# Patient Record
Sex: Male | Born: 1960 | Race: White | Hispanic: No | Marital: Married | Smoking: Never smoker
Health system: Southern US, Community
[De-identification: ages and names within clinical notes are randomized; demographics above are authoritative.]

## PROBLEM LIST (undated history)

## (undated) DIAGNOSIS — I10 Essential (primary) hypertension: Secondary | ICD-10-CM

## (undated) DIAGNOSIS — K219 Gastro-esophageal reflux disease without esophagitis: Secondary | ICD-10-CM

---

## 2010-11-26 HISTORY — PX: COLONOSCOPY: SHX174

## 2013-11-21 ENCOUNTER — Encounter (HOSPITAL_COMMUNITY): Payer: Self-pay | Admitting: Emergency Medicine

## 2013-11-21 ENCOUNTER — Telehealth (HOSPITAL_COMMUNITY): Payer: Self-pay | Admitting: *Deleted

## 2013-11-21 ENCOUNTER — Emergency Department (HOSPITAL_COMMUNITY)
Admission: EM | Admit: 2013-11-21 | Discharge: 2013-11-21 | Disposition: A | Discharge status: Home / Self Care | Payer: No Typology Code available for payment source | Attending: Emergency Medicine | Admitting: Emergency Medicine

## 2013-11-21 DIAGNOSIS — L02519 Cutaneous abscess of unspecified hand: Secondary | ICD-10-CM | POA: Insufficient documentation

## 2013-11-21 DIAGNOSIS — I10 Essential (primary) hypertension: Secondary | ICD-10-CM | POA: Insufficient documentation

## 2013-11-21 DIAGNOSIS — L03019 Cellulitis of unspecified finger: Secondary | ICD-10-CM | POA: Insufficient documentation

## 2013-11-21 DIAGNOSIS — IMO0002 Reserved for concepts with insufficient information to code with codable children: Secondary | ICD-10-CM

## 2013-11-21 DIAGNOSIS — Z8614 Personal history of Methicillin resistant Staphylococcus aureus infection: Secondary | ICD-10-CM | POA: Insufficient documentation

## 2013-11-21 DIAGNOSIS — Z7982 Long term (current) use of aspirin: Secondary | ICD-10-CM | POA: Insufficient documentation

## 2013-11-21 DIAGNOSIS — Z79899 Other long term (current) drug therapy: Secondary | ICD-10-CM | POA: Insufficient documentation

## 2013-11-21 DIAGNOSIS — Z792 Long term (current) use of antibiotics: Secondary | ICD-10-CM | POA: Insufficient documentation

## 2013-11-21 HISTORY — DX: Essential (primary) hypertension: I10

## 2013-11-21 LAB — BASIC METABOLIC PANEL
BUN: 18 mg/dL (ref 6–23)
CO2: 25 mEq/L (ref 19–32)
Calcium: 9.2 mg/dL (ref 8.4–10.5)
Chloride: 102 mEq/L (ref 96–112)
Creatinine, Ser: 1.11 mg/dL (ref 0.50–1.35)
GFR calc non Af Amer: 74 mL/min — ABNORMAL LOW (ref >90)
Glucose, Bld: 105 mg/dL — ABNORMAL HIGH (ref 70–99)
Potassium: 4.1 mEq/L (ref 3.5–5.1)
Sodium: 136 mEq/L (ref 135–145)

## 2013-11-21 LAB — CBC
Hemoglobin: 16.3 g/dL (ref 13.0–17.0)
MCH: 31.8 pg (ref 26.0–34.0)
RBC: 5.13 MIL/uL (ref 4.22–5.81)

## 2013-11-21 MED ORDER — SULFAMETHOXAZOLE-TRIMETHOPRIM 800-160 MG PO TABS: 1.0000 | ORAL_TABLET | Freq: Two times a day (BID) | ORAL | Status: DC

## 2013-11-21 NOTE — ED Notes (Signed)
Calling to get a work note

## 2013-11-21 NOTE — ED Provider Notes (Signed)
CSN: [id number]     Arrival date & time 11/21/13  1226 History   First MD Initiated Contact with Patient 11/21/13 1620     Chief Complaint  Patient presents with  . Finger Injury   (Consider location/radiation/quality/duration/timing/severity/associated sxs/prior Treatment) The history is provided by the patient and medical records.   This is a 52 year old male with past medical history significant for hypertension and recurrent MRSA infections, presenting to the ED for infection with right middle finger. Patient states he pulled a hair on his right middle finger and subsequently developed an infection 1 week ago. Patient was given Kenalog shot and started on Keflex which he has been taking for the past week without improvement. Pt has since started experiencing some sero-sanguinous drainage from the finger.  Patient was seen in urgent care and sent to the ED for IV antibiotics and hand surgery consult. Patient denies any recent fevers, sweats, or chills. He does endorse some intermittent numbness and paresthesias of his right middle finger. No prior history of MRSA or osteomyelitis.  Past Medical History  Diagnosis Date  . Hypertension    History reviewed. No pertinent past surgical history. No family history on file. History  Substance Use Topics  . Smoking status: Not on file  . Smokeless tobacco: Not on file  . Alcohol Use: No    Review of Systems  Skin: Positive for wound.  All other systems reviewed and are negative.    Allergies  Review of patient's allergies indicates no known allergies.  Home Medications   Current Outpatient Rx  Name  Route  Sig  Dispense  Refill  . acetaminophen (TYLENOL) 325 MG tablet   Oral   Take 650 mg by mouth every 6 (six) hours as needed.         Marland Kitchen aspirin 81 MG tablet   Oral   Take 81 mg by mouth daily.         . cephALEXin (KEFLEX) 500 MG capsule   Oral   Take 500 mg by mouth 2 (two) times daily.         . Cholecalciferol  (VITAMIN D) 2000 UNITS CAPS   Oral   Take 1 capsule by mouth daily.         Marland Kitchen losartan (COZAAR) 100 MG tablet   Oral   Take 100 mg by mouth daily.         . nabumetone (RELAFEN) 750 MG tablet   Oral   Take 750 mg by mouth 2 (two) times daily as needed for moderate pain.         Marland Kitchen neomycin-bacitracin-polymyxin (NEOSPORIN) ointment   Topical   Apply 1 application topically 3 (three) times daily. apply to eye         . Omega-3 Fatty Acids (FISH OIL) 1200 MG CAPS   Oral   Take 1 capsule by mouth 2 (two) times daily.         Marland Kitchen omeprazole (PRILOSEC) 40 MG capsule   Oral   Take 40 mg by mouth daily.          BP 150/92  Pulse 61  Temp(Src) 97.9 F (36.6 C) (Oral)  Resp 16  Wt 206 lb 7 oz (93.639 kg)  SpO2 100%  Physical Exam  Nursing note and vitals reviewed. Constitutional: He is oriented to person, place, and time. He appears well-developed and well-nourished.  HENT:  Head: Normocephalic and atraumatic.  Mouth/Throat: Oropharynx is clear and moist.  Eyes: Conjunctivae and EOM are  normal. Pupils are equal, round, and reactive to light.  Neck: Normal range of motion.  Cardiovascular: Normal rate, regular rhythm and normal heart sounds.   Pulmonary/Chest: Effort normal and breath sounds normal. No respiratory distress. He has no wheezes.  Musculoskeletal: Normal range of motion.       Right hand: He exhibits tenderness and swelling. He exhibits no deformity and no laceration.       Hands: Right middle finger with cellulitis along the dorsal proximal phalanx, faint streaking into dorsal hand; small area of open ulceration with serosanguineous drainage; limited range of motion in the finger due to swelling however has full passive ROM; strong radial pulse and cap refill; sensation itnact  Neurological: He is alert and oriented to person, place, and time.  Skin: Skin is warm and dry.  Psychiatric: He has a normal mood and affect.       ED Course  Procedures  (including critical care time)  INCISION AND DRAINAGE Performed by: Garlon Hatchet Consent: Verbal consent obtained. Risks and benefits: risks, benefits and alternatives were discussed Type: abscess  Body area: right middle finger, proximal phalanx  Anesthesia: local infiltration  Incision was made with a scalpel.  Local anesthetic: lidocaine 1% without epinephrine  Anesthetic total: 4 ml  Complexity: complex Blunt dissection to break up loculations  Drainage: sero-sanguinous, bloody  Drainage amount: small  Packing material: none  Patient tolerance: Patient tolerated the procedure well with no immediate complications.    Labs Review Labs Reviewed  CBC  BASIC METABOLIC PANEL   Imaging Review No results found.  EKG Interpretation   None       MDM   1. Cellulitis and abscess of digit    Bedside ultrasound revealing small pocket of fluid beneath skin surface.  I&D performed with small amount of sero-sanguinous drainage expelled.  Pt has on localized cellulitis without extension throughout finger, normal sensation, full passive ROM-- low suspicion for tendon sheath infection at this time.  Pt afebrile, non-toxic appearing, NAD, VS stable- ok for discharge.  Will change abx to bactrim.  Instructed pt on home wound care and monitor for signs of infection-- increased/spreading redness, purulent drainage, swelling, high fever, chills, etc.  Discussed plan with pt and wife, they acknowledged understanding and agreed with plan of care.  Return precautions advised.  Discussed with Dr. Fonnie Jarvis who personally evaluated pt and agrees with assessment and plan of care.  Garlon Hatchet, PA-C 11/21/13 1905

## 2013-11-21 NOTE — ED Notes (Signed)
Pt. Stated, pulled a hair and it became infected on his right middle finger.  Pt. Has taken a Kenolog shot on Monday and Keflex since Monday twice a day. Its no better.  Red and swollen.  Its the 4th episode of infections.

## 2013-11-21 NOTE — ED Provider Notes (Signed)
Medical screening examination/treatment/procedure(s) were conducted as a shared visit with non-physician practitioner(s) and myself.  I personally evaluated the patient during the encounter.  EKG Interpretation   None      Limited bedside US reveals apparent localized non-midline subcut fluid collection suggesting possible abscess  Hurman Horn, MD 11/23/13 2117

## 2013-12-03 ENCOUNTER — Encounter (HOSPITAL_BASED_OUTPATIENT_CLINIC_OR_DEPARTMENT_OTHER): Payer: Self-pay | Admitting: *Deleted

## 2013-12-03 ENCOUNTER — Other Ambulatory Visit: Payer: Self-pay

## 2013-12-03 ENCOUNTER — Other Ambulatory Visit: Payer: Self-pay | Admitting: Orthopedic Surgery

## 2013-12-03 ENCOUNTER — Encounter (HOSPITAL_BASED_OUTPATIENT_CLINIC_OR_DEPARTMENT_OTHER)
Admission: RE | Disposition: A | Discharge status: Home / Self Care | Payer: Self-pay | Source: Ambulatory Visit | Attending: Orthopedic Surgery

## 2013-12-03 ENCOUNTER — Ambulatory Visit (HOSPITAL_BASED_OUTPATIENT_CLINIC_OR_DEPARTMENT_OTHER): Payer: No Typology Code available for payment source | Admitting: Certified Registered"

## 2013-12-03 ENCOUNTER — Encounter (HOSPITAL_BASED_OUTPATIENT_CLINIC_OR_DEPARTMENT_OTHER): Payer: No Typology Code available for payment source | Admitting: Certified Registered"

## 2013-12-03 ENCOUNTER — Ambulatory Visit (HOSPITAL_BASED_OUTPATIENT_CLINIC_OR_DEPARTMENT_OTHER)
Admission: RE | Admit: 2013-12-03 | Discharge: 2013-12-03 | Disposition: A | Discharge status: Home / Self Care | Payer: No Typology Code available for payment source | Source: Ambulatory Visit | Attending: Orthopedic Surgery | Admitting: Orthopedic Surgery

## 2013-12-03 DIAGNOSIS — Z7982 Long term (current) use of aspirin: Secondary | ICD-10-CM | POA: Insufficient documentation

## 2013-12-03 DIAGNOSIS — K219 Gastro-esophageal reflux disease without esophagitis: Secondary | ICD-10-CM | POA: Insufficient documentation

## 2013-12-03 DIAGNOSIS — I10 Essential (primary) hypertension: Secondary | ICD-10-CM | POA: Insufficient documentation

## 2013-12-03 DIAGNOSIS — L0889 Other specified local infections of the skin and subcutaneous tissue: Secondary | ICD-10-CM | POA: Insufficient documentation

## 2013-12-03 DIAGNOSIS — Z792 Long term (current) use of antibiotics: Secondary | ICD-10-CM | POA: Insufficient documentation

## 2013-12-03 HISTORY — DX: Gastro-esophageal reflux disease without esophagitis: K21.9

## 2013-12-03 HISTORY — PX: INCISION AND DRAINAGE: SHX5863

## 2013-12-03 SURGERY — INCISION AND DRAINAGE
Anesthesia: General | Site: Hand | Laterality: Right

## 2013-12-03 MED ORDER — VANCOMYCIN HCL IN DEXTROSE 500-5 MG/100ML-% IV SOLN
INTRAVENOUS | Status: AC
  Filled 2013-12-03: qty 200

## 2013-12-03 MED ORDER — 0.9 % SODIUM CHLORIDE (POUR BTL) OPTIME
TOPICAL | Status: DC | PRN
  Administered 2013-12-03: 300 mL

## 2013-12-03 MED ORDER — ONDANSETRON HCL 4 MG/2ML IJ SOLN
INTRAMUSCULAR | Status: DC | PRN
  Administered 2013-12-03: 4 mg via INTRAVENOUS

## 2013-12-03 MED ORDER — PROPOFOL 10 MG/ML IV BOLUS
INTRAVENOUS | Status: AC
  Filled 2013-12-03: qty 20

## 2013-12-03 MED ORDER — VANCOMYCIN HCL 1000 MG IV SOLR
1000.0000 mg | INTRAVENOUS | Status: DC | PRN
  Administered 2013-12-03: 1000 mg via INTRAVENOUS

## 2013-12-03 MED ORDER — ONDANSETRON HCL 4 MG/2ML IJ SOLN
4.0000 mg | Freq: Once | INTRAMUSCULAR | Status: DC | PRN
Start: 2013-12-03 — End: 2013-12-03

## 2013-12-03 MED ORDER — SULFAMETHOXAZOLE-TRIMETHOPRIM 800-160 MG PO TABS: 1.0000 | ORAL_TABLET | Freq: Two times a day (BID) | ORAL | Status: DC

## 2013-12-03 MED ORDER — HYDROMORPHONE HCL PF 1 MG/ML IJ SOLN
0.2500 mg | INTRAMUSCULAR | Status: DC | PRN
  Administered 2013-12-03: 0.5 mg via INTRAVENOUS
  Filled 2013-12-03: qty 1

## 2013-12-03 MED ORDER — MIDAZOLAM HCL 5 MG/5ML IJ SOLN
INTRAMUSCULAR | Status: DC | PRN
  Administered 2013-12-03: 2 mg via INTRAVENOUS

## 2013-12-03 MED ORDER — BUPIVACAINE HCL (PF) 0.25 % IJ SOLN
INTRAMUSCULAR | Status: AC
  Filled 2013-12-03: qty 30

## 2013-12-03 MED ORDER — CHLORHEXIDINE GLUCONATE 4 % EX LIQD: 60.0000 mL | Freq: Once | CUTANEOUS | Status: DC

## 2013-12-03 MED ORDER — PROPOFOL 10 MG/ML IV BOLUS
INTRAVENOUS | Status: DC | PRN
  Administered 2013-12-03: 200 mg via INTRAVENOUS

## 2013-12-03 MED ORDER — OXYCODONE HCL 5 MG PO TABS: 5.0000 mg | ORAL_TABLET | Freq: Once | ORAL | Status: DC | PRN

## 2013-12-03 MED ORDER — BUPIVACAINE HCL (PF) 0.25 % IJ SOLN
INTRAMUSCULAR | Status: DC | PRN
  Administered 2013-12-03: 9.5 mL

## 2013-12-03 MED ORDER — DEXAMETHASONE SODIUM PHOSPHATE 10 MG/ML IJ SOLN
INTRAMUSCULAR | Status: DC | PRN
  Administered 2013-12-03: 10 mg via INTRAVENOUS

## 2013-12-03 MED ORDER — OXYCODONE HCL 5 MG/5ML PO SOLN: 5.0000 mg | Freq: Once | ORAL | Status: DC | PRN

## 2013-12-03 MED ORDER — HYDROCODONE-ACETAMINOPHEN 5-325 MG PO TABS: ORAL_TABLET | ORAL | Status: DC

## 2013-12-03 MED ORDER — FENTANYL CITRATE 0.05 MG/ML IJ SOLN
INTRAMUSCULAR | Status: DC | PRN
  Administered 2013-12-03 (×2): 50 ug via INTRAVENOUS

## 2013-12-03 MED ORDER — FENTANYL CITRATE 0.05 MG/ML IJ SOLN
INTRAMUSCULAR | Status: AC
  Filled 2013-12-03: qty 4

## 2013-12-03 MED ORDER — MIDAZOLAM HCL 2 MG/2ML IJ SOLN
INTRAMUSCULAR | Status: AC
  Filled 2013-12-03: qty 2

## 2013-12-03 MED ORDER — LACTATED RINGERS IV SOLN
INTRAVENOUS | Status: DC
  Administered 2013-12-03: 13:00:00 via INTRAVENOUS
  Administered 2013-12-03: 20 mL/h via INTRAVENOUS

## 2013-12-03 MED ORDER — LIDOCAINE HCL (CARDIAC) 20 MG/ML IV SOLN
INTRAVENOUS | Status: DC | PRN
  Administered 2013-12-03: 60 mg via INTRAVENOUS

## 2013-12-03 SURGICAL SUPPLY — 50 items
BAG DECANTER FOR FLEXI CONT (MISCELLANEOUS) IMPLANT
BANDAGE ELASTIC 3 VELCRO ST LF (GAUZE/BANDAGES/DRESSINGS) ×2 IMPLANT
BANDAGE GAUZE STRT 1 STR LF (GAUZE/BANDAGES/DRESSINGS) IMPLANT
BLADE MINI RND TIP GREEN BEAV (BLADE) IMPLANT
BLADE SURG 15 STRL LF DISP TIS (BLADE) ×2 IMPLANT
BLADE SURG 15 STRL SS (BLADE) ×2
BNDG COHESIVE 1X5 TAN STRL LF (GAUZE/BANDAGES/DRESSINGS) IMPLANT
BNDG ELASTIC 2 VLCR STRL LF (GAUZE/BANDAGES/DRESSINGS) IMPLANT
BNDG ESMARK 4X9 LF (GAUZE/BANDAGES/DRESSINGS) ×2 IMPLANT
BNDG GAUZE ELAST 4 BULKY (GAUZE/BANDAGES/DRESSINGS) ×2 IMPLANT
CHLORAPREP W/TINT 26ML (MISCELLANEOUS) ×2 IMPLANT
CORDS BIPOLAR (ELECTRODE) ×2 IMPLANT
COVER MAYO STAND STRL (DRAPES) ×2 IMPLANT
COVER TABLE BACK 60X90 (DRAPES) ×2 IMPLANT
CUFF TOURNIQUET SINGLE 18IN (TOURNIQUET CUFF) ×2 IMPLANT
DRAPE EXTREMITY T 121X128X90 (DRAPE) ×2 IMPLANT
DRAPE SURG 17X23 STRL (DRAPES) ×2 IMPLANT
GAUZE PACKING IODOFORM 1/4X15 (GAUZE/BANDAGES/DRESSINGS) ×2 IMPLANT
GAUZE XEROFORM 1X8 LF (GAUZE/BANDAGES/DRESSINGS) IMPLANT
GLOVE BIO SURGEON STRL SZ7.5 (GLOVE) ×2 IMPLANT
GLOVE BIOGEL PI IND STRL 7.0 (GLOVE) ×2 IMPLANT
GLOVE BIOGEL PI IND STRL 8 (GLOVE) ×1 IMPLANT
GLOVE BIOGEL PI INDICATOR 7.0 (GLOVE) ×2
GLOVE BIOGEL PI INDICATOR 8 (GLOVE) ×1
GLOVE ECLIPSE 6.5 STRL STRAW (GLOVE) ×2 IMPLANT
GLOVE ECLIPSE 7.0 STRL STRAW (GLOVE) ×2 IMPLANT
GLOVE EXAM NITRILE PF MED BLUE (GLOVE) ×2 IMPLANT
GOWN STRL REUS W/ TWL LRG LVL3 (GOWN DISPOSABLE) ×1 IMPLANT
GOWN STRL REUS W/TWL LRG LVL3 (GOWN DISPOSABLE) ×1
GOWN STRL REUS W/TWL XL LVL3 (GOWN DISPOSABLE) ×2 IMPLANT
LOOP VESSEL MAXI BLUE (MISCELLANEOUS) IMPLANT
NEEDLE HYPO 25X1 1.5 SAFETY (NEEDLE) ×2 IMPLANT
NS IRRIG 1000ML POUR BTL (IV SOLUTION) ×2 IMPLANT
PACK BASIN DAY SURGERY FS (CUSTOM PROCEDURE TRAY) ×2 IMPLANT
PAD CAST 3X4 CTTN HI CHSV (CAST SUPPLIES) IMPLANT
PADDING CAST ABS 4INX4YD NS (CAST SUPPLIES) ×1
PADDING CAST ABS COTTON 4X4 ST (CAST SUPPLIES) ×1 IMPLANT
PADDING CAST COTTON 3X4 STRL (CAST SUPPLIES)
SPLINT PLASTER CAST XFAST 3X15 (CAST SUPPLIES) ×10 IMPLANT
SPLINT PLASTER XTRA FASTSET 3X (CAST SUPPLIES) ×10
SPONGE GAUZE 4X4 12PLY (GAUZE/BANDAGES/DRESSINGS) ×2 IMPLANT
STOCKINETTE 4X48 STRL (DRAPES) ×2 IMPLANT
SUT ETHILON 4 0 PS 2 18 (SUTURE) IMPLANT
SWAB COLLECTION DEVICE MRSA (MISCELLANEOUS) ×2 IMPLANT
SYR BULB 3OZ (MISCELLANEOUS) ×2 IMPLANT
SYR CONTROL 10ML LL (SYRINGE) ×2 IMPLANT
TOWEL OR 17X24 6PK STRL BLUE (TOWEL DISPOSABLE) ×2 IMPLANT
TUBE ANAEROBIC SPECIMEN COL (MISCELLANEOUS) ×2 IMPLANT
TUBE FEEDING 5FR 15 INCH (TUBING) IMPLANT
UNDERPAD 30X30 INCONTINENT (UNDERPADS AND DIAPERS) ×2 IMPLANT

## 2013-12-03 NOTE — H&P (Signed)
  Melvin Crawford is an 53 y.o. male.   Chief Complaint: right long finger infection HPI: 53 yo rhd male states he pulled hair from right index finger ~11/15/13 and started to have redness and swelling in area a couple of days later.  [facility] where I&D performed by ED staff.  Seen at PCP afterward.  Has had continued erythema of area.  No fevers, chills, night sweats.  No previous problem with right long finger.  Past Medical History  Diagnosis Date  . Hypertension   . GERD (gastroesophageal reflux disease)     Past Surgical History  Procedure Laterality Date  . Colonoscopy  2012    History reviewed. No pertinent family history. Social History:  reports that he has never smoked. He has never used smokeless tobacco. He reports that he does not drink alcohol or use illicit drugs.  Allergies:  Allergies  Allergen Reactions  . Codeine Nausea And Vomiting    Medications Prior to Admission  Medication Sig Dispense Refill  . acetaminophen (TYLENOL) 325 MG tablet Take 650 mg by mouth every 6 (six) hours as needed.      Marland Kitchen. aspirin 81 MG tablet Take 81 mg by mouth daily.      . cephALEXin (KEFLEX) 500 MG capsule Take 500 mg by mouth 2 (two) times daily.      . Cholecalciferol (VITAMIN D) 2000 UNITS CAPS Take 1 capsule by mouth daily.      Marland Kitchen. losartan (COZAAR) 100 MG tablet Take 100 mg by mouth daily.      . nabumetone (RELAFEN) 750 MG tablet Take 750 mg by mouth 2 (two) times daily as needed for moderate pain.      Marland Kitchen. neomycin-bacitracin-polymyxin (NEOSPORIN) ointment Apply 1 application topically 3 (three) times daily. apply to finger      . Omega-3 Fatty Acids (FISH OIL) 1200 MG CAPS Take 1 capsule by mouth 2 (two) times daily.      Marland Kitchen. omeprazole (PRILOSEC) 40 MG capsule Take 40 mg by mouth daily.      Marland Kitchen. sulfamethoxazole-trimethoprim (SEPTRA DS) 800-160 MG per tablet Take 1 tablet by mouth every 12 (twelve) hours.  20 tablet  0    No results found for this or any previous  visit (from the past 48 hour(s)).  No results found.   A comprehensive review of systems was negative except for: Eyes: positive for contacts/glasses  Blood pressure 151/90, pulse 57, temperature 97.5 F (36.4 C), temperature source Oral, resp. rate 20, height 5\' 9"  (1.753 m), weight 208 lb 2 oz (94.405 kg), SpO2 99.00%.  General appearance: alert, cooperative and appears stated age Head: Normocephalic, without obvious abnormality, atraumatic Neck: supple, symmetrical, trachea midline Resp: clear to auscultation bilaterally Cardio: regular rate and rhythm GI: non tender Extremities: intact sensation and capillary refill all digits.  +epl/fpl/io.  right long with erythema dorsally over proximal phalanx.  no proximal streaking.  no active drainage.  no volar tenderness or erythema. Pulses: 2+ and symmetric Skin: as above Neurologic: Grossly normal Incision/Wound: As above  Assessment/Plan Right long finger infection.  Non operative and operative treatment options were discussed with the patient and patient wishes to proceed with operative treatment. Plan I&D right long finger in OR.  Risks, benefits, and alternatives of surgery were discussed and the patient agrees with the plan of care.   Melvin Crawford R 12/03/2013, 1:06 PM

## 2013-12-03 NOTE — Anesthesia Postprocedure Evaluation (Signed)
  Anesthesia Post-op Note  Patient: Melvin Crawford  Procedure(s) Performed: Procedure(s): INCISION AND DRAINAGE RIGHT LONG FINGER  (Right)  Patient Location: PACU  Anesthesia Type:General  Level of Consciousness: awake, alert  and oriented  Airway and Oxygen Therapy: Patient Spontanous Breathing  Post-op Pain: mild  Post-op Assessment: Post-op Vital signs reviewed  Post-op Vital Signs: Reviewed  Complications: No apparent anesthesia complications

## 2013-12-03 NOTE — Discharge Instructions (Addendum)

## 2013-12-03 NOTE — Brief Op Note (Signed)
12/03/2013  1:58 PM  PATIENT:  Melvin Crawford  53 y.o. male  PRE-OPERATIVE DIAGNOSIS:  right long finger infection  POST-OPERATIVE DIAGNOSIS: right long finger infection  PROCEDURE:  Procedure(s) with comments: INCISION AND DRAINAGE (Right) - I&D RIGHT LONG FINGER   SURGEON:  Surgeon(s) and Role:    * Tami RibasKevin R Adena Sima, MD - Primary  PHYSICIAN ASSISTANT:   ASSISTANTS: none   ANESTHESIA:   general  EBL:  Total I/O In: 700 [I.V.:700] Out: -   BLOOD ADMINISTERED:none  DRAINS: iodoform packing  LOCAL MEDICATIONS USED:  MARCAINE     SPECIMEN:  Source of Specimen:  right long finger  DISPOSITION OF SPECIMEN:  micro  COUNTS:  YES  TOURNIQUET:   Total Tourniquet Time Documented: Upper Arm (Right) - 11 minutes Total: Upper Arm (Right) - 11 minutes   DICTATION: .Other Dictation: Dictation Number [id number]  PLAN OF CARE: Discharge to home after PACU  PATIENT DISPOSITION:  PACU - hemodynamically stable.

## 2013-12-03 NOTE — Op Note (Signed)
[id number] 

## 2013-12-03 NOTE — Transfer of Care (Signed)
Immediate Anesthesia Transfer of Care Note  Patient: Melvin Crawford  Procedure(s) Performed: Procedure(s) with comments: INCISION AND DRAINAGE (Right) - I&D RIGHT LONG FINGER   Patient Location: PACU  Anesthesia Type:General  Level of Consciousness: awake, alert , oriented and patient cooperative  Airway & Oxygen Therapy: Patient Spontanous Breathing and Patient connected to face mask oxygen  Post-op Assessment: Report given to PACU RN and Post -op Vital signs reviewed and stable  Post vital signs: Reviewed and stable  Complications: No apparent anesthesia complications

## 2013-12-03 NOTE — Anesthesia Preprocedure Evaluation (Signed)
Anesthesia Evaluation  Patient identified by MRN, date of birth, ID band Patient awake    Reviewed: Allergy & Precautions, H&P , NPO status , Patient's Chart, lab work & pertinent test results  Airway Mallampati: I TM Distance: >3 FB Neck ROM: Full    Dental  (+) Teeth Intact, Partial Lower and Dental Advisory Given   Pulmonary  breath sounds clear to auscultation        Cardiovascular hypertension, Pt. on medications Rhythm:Regular Rate:Normal     Neuro/Psych    GI/Hepatic   Endo/Other    Renal/GU      Musculoskeletal   Abdominal   Peds  Hematology   Anesthesia Other Findings   Reproductive/Obstetrics                           Anesthesia Physical Anesthesia Plan  ASA: II  Anesthesia Plan: General   Post-op Pain Management:    Induction: Intravenous  Airway Management Planned: LMA  Additional Equipment:   Intra-op Plan:   Post-operative Plan: Extubation in OR  Informed Consent: I have reviewed the patients History and Physical, chart, labs and discussed the procedure including the risks, benefits and alternatives for the proposed anesthesia with the patient or authorized representative who has indicated his/her understanding and acceptance.   Dental advisory given  Plan Discussed with: CRNA, Anesthesiologist and Surgeon  Anesthesia Plan Comments:         Anesthesia Quick Evaluation

## 2013-12-03 NOTE — Anesthesia Procedure Notes (Signed)
Procedure Name: LMA Insertion Date/Time: 12/03/2013 1:34 PM Performed by: Berdell Nevitt Pre-anesthesia Checklist: Patient identified, Emergency Drugs available, Suction available and Patient being monitored Patient Re-evaluated:Patient Re-evaluated prior to inductionOxygen Delivery Method: Circle System Utilized Preoxygenation: Pre-oxygenation with 100% oxygen Intubation Type: IV induction Ventilation: Mask ventilation without difficulty LMA: LMA inserted LMA Size: 5.0 Number of attempts: 1 Airway Equipment and Method: bite block Placement Confirmation: positive ETCO2 Tube secured with: Tape Dental Injury: Teeth and Oropharynx as per pre-operative assessment

## 2013-12-04 ENCOUNTER — Encounter (HOSPITAL_BASED_OUTPATIENT_CLINIC_OR_DEPARTMENT_OTHER): Payer: Self-pay | Admitting: Orthopedic Surgery

## 2013-12-04 LAB — POCT I-STAT, CHEM 8
BUN: 16 mg/dL (ref 6–23)
CALCIUM ION: 1.2 mmol/L (ref 1.12–1.23)
CREATININE: 1.3 mg/dL (ref 0.50–1.35)
Chloride: 102 mEq/L (ref 96–112)
Glucose, Bld: 107 mg/dL — ABNORMAL HIGH (ref 70–99)
HCT: 50 % (ref 39.0–52.0)
Hemoglobin: 17 g/dL (ref 13.0–17.0)
Potassium: 4.4 mEq/L (ref 3.7–5.3)
Sodium: 137 mEq/L (ref 137–147)
TCO2: 24 mmol/L (ref 0–100)

## 2013-12-04 NOTE — Op Note (Signed)
NAME:  Melvin Crawford, Melvin Crawford             ACCOUNT NO.:  [id number]  MEDICAL RECORD NO.:  [id number]  LOCATION:                                 FACILITY:  PHYSICIAN:  Betha LoaKevin Lamari Beckles, MD        DATE OF BIRTH:  Oct 08, 1961  DATE OF PROCEDURE:  12/03/2013 DATE OF DISCHARGE:                              OPERATIVE REPORT   PREOPERATIVE DIAGNOSIS:  Right long finger infection.  POSTOPERATIVE DIAGNOSIS:  Right long finger infection.  PROCEDURE:  Incision and drainage of right long finger.  SURGEON:  Betha LoaKevin Dyamon Sosinski, MD  ASSISTANT:  None.  ANESTHESIA:  General.  IV FLUIDS:  Per anesthesia flow sheet.  ESTIMATED BLOOD LOSS:  Minimal.  COMPLICATIONS:  None.  SPECIMENS:  Cultures from right long finger to micro.  TOURNIQUET TIME:  11 minutes.  DISPOSITION:  Stable to PACU.  INDICATIONS:  Melvin Crawford is a 53 year old male who approximately 2 weeks ago pulled a hair out of his finger and then began to have swelling and erythema of the finger.  [facility] 9 days ago where an incision and drainage was performed.  He expressed significant amount of pus from the wound the following day.  He has had continued erythema of the finger.  He was referred to me for further care.  I discussed with Melvin Crawford and his wife the nature of the condition.  We discussed continued antibiotic therapy versus operative incision and drainage.  They elected to proceed with operative incision and drainage.  Risks, benefits, and alternatives of the surgery were discussed including risk of blood loss, infection, damage to nerves, vessels, tendons, ligaments, bone; failure of surgery; need for additional surgery, complications with wound healing, continued pain, continued infection, need for repeat irrigation and debridement. They voiced understanding of these risks and elected to proceed.  OPERATIVE COURSE:  After being identified preoperatively by myself, the patient and I  agreed upon procedure and site of procedure.  Surgical site was marked.  The risks, benefits, and alternatives of surgery were reviewed and he wished to proceed.  Surgical consent had been signed. He was transported to the operating room, placed on the operating room table in a supine position with the right upper extremity on arm board. General anesthesia was induced by the anesthesiologist.  The right upper extremity was prepped and draped in normal sterile orthopedic fashion. Surgical pause was performed between surgeons, anesthesia, and operating room staff, and all were in agreement as to the patient, procedure, and site of procedure.  Tourniquet at the proximal aspect of the extremity was inflated to 250 mmHg after gravity exsanguination of the hand and Esmarch exsanguination of the forearm.  Incision was made on the dorsum of the finger in the area of erythema.  Skin and subcutaneous tissues by spreading technique.  There was no gross purulence.  The fat was very indurated.  There was some cloudy whitish fluid.  Cultures were taken for aerobes and anaerobes and sent to micro for examination.  The wound was copiously irrigated with sterile saline.  It was inspected to ensure there was no separate cavity, which there was none.  The wound  was then packed open with quarter-inch iodoform gauze.  A digital block was performed with 10 mL of 0.25% plain Marcaine to aid in postoperative analgesia.  The wound was dressed with sterile 4x4s and wrapped with a Kerlix bandage.  A volar splint was placed and wrapped with Kerlix and Ace bandage.  Tourniquet was deflated at 11 minutes.  Fingertips were pink with brisk capillary refill after deflation of the tourniquet. Operative drapes were broken down and the patient was awoken from anesthesia safely.  He was transferred back to stretcher and taken to PACU in stable condition.  I will see him back in the office on Monday for operative followup.  I  will give him Norco 5/325, 1-2 p.o. q.6 hours p.r.n. pain, dispensed #30 and Bactrim DS 1 p.o. b.i.d. x7 days.     Betha Loa, MD     KK/MEDQ  D:  12/03/2013  T:  12/04/2013  Job:  [id number]

## 2013-12-07 LAB — CULTURE, ROUTINE-ABSCESS: CULTURE: NO GROWTH

## 2013-12-08 LAB — ANAEROBIC CULTURE

## 2016-01-17 DIAGNOSIS — L409 Psoriasis, unspecified: Secondary | ICD-10-CM

## 2016-01-17 DIAGNOSIS — G4730 Sleep apnea, unspecified: Secondary | ICD-10-CM | POA: Insufficient documentation

## 2016-01-17 DIAGNOSIS — R5382 Chronic fatigue, unspecified: Secondary | ICD-10-CM | POA: Insufficient documentation

## 2016-01-17 DIAGNOSIS — E785 Hyperlipidemia, unspecified: Secondary | ICD-10-CM

## 2016-01-17 DIAGNOSIS — Z79899 Other long term (current) drug therapy: Secondary | ICD-10-CM

## 2016-01-17 DIAGNOSIS — J984 Other disorders of lung: Secondary | ICD-10-CM

## 2016-01-17 DIAGNOSIS — N529 Male erectile dysfunction, unspecified: Secondary | ICD-10-CM

## 2016-01-17 DIAGNOSIS — E559 Vitamin D deficiency, unspecified: Secondary | ICD-10-CM

## 2016-01-17 DIAGNOSIS — I10 Essential (primary) hypertension: Secondary | ICD-10-CM | POA: Insufficient documentation

## 2016-01-17 HISTORY — DX: Vitamin D deficiency, unspecified: E55.9

## 2016-01-17 HISTORY — DX: Hyperlipidemia, unspecified: E78.5

## 2016-01-17 HISTORY — DX: Chronic fatigue, unspecified: R53.82

## 2016-01-17 HISTORY — DX: Essential (primary) hypertension: I10

## 2016-01-17 HISTORY — DX: Sleep apnea, unspecified: G47.30

## 2016-01-17 HISTORY — DX: Male erectile dysfunction, unspecified: N52.9

## 2016-01-17 HISTORY — DX: Psoriasis, unspecified: L40.9

## 2016-01-17 HISTORY — DX: Other long term (current) drug therapy: Z79.899

## 2016-01-17 HISTORY — DX: Other disorders of lung: J98.4

## 2016-01-18 DIAGNOSIS — Z0000 Encounter for general adult medical examination without abnormal findings: Secondary | ICD-10-CM

## 2016-01-18 DIAGNOSIS — J40 Bronchitis, not specified as acute or chronic: Secondary | ICD-10-CM

## 2016-01-18 HISTORY — DX: Bronchitis, not specified as acute or chronic: J40

## 2016-01-18 HISTORY — DX: Encounter for general adult medical examination without abnormal findings: Z00.00

## 2016-02-17 DIAGNOSIS — R7303 Prediabetes: Secondary | ICD-10-CM

## 2016-02-17 HISTORY — DX: Prediabetes: R73.03

## 2016-02-28 ENCOUNTER — Ambulatory Visit (INDEPENDENT_AMBULATORY_CARE_PROVIDER_SITE_OTHER)
Admission: RE | Admit: 2016-02-28 | Discharge: 2016-02-28 | Disposition: A | Discharge status: Home / Self Care | Payer: No Typology Code available for payment source | Source: Ambulatory Visit | Attending: Pulmonary Disease | Admitting: Pulmonary Disease

## 2016-02-28 ENCOUNTER — Encounter (INDEPENDENT_AMBULATORY_CARE_PROVIDER_SITE_OTHER): Payer: Self-pay

## 2016-02-28 ENCOUNTER — Encounter: Payer: Self-pay | Admitting: Pulmonary Disease

## 2016-02-28 ENCOUNTER — Ambulatory Visit (INDEPENDENT_AMBULATORY_CARE_PROVIDER_SITE_OTHER): Payer: No Typology Code available for payment source | Admitting: Pulmonary Disease

## 2016-02-28 VITALS — BP 138/78 | HR 75 | Ht 69.0 in | Wt 214.0 lb

## 2016-02-28 DIAGNOSIS — R0689 Other abnormalities of breathing: Secondary | ICD-10-CM

## 2016-02-28 DIAGNOSIS — R0600 Dyspnea, unspecified: Secondary | ICD-10-CM | POA: Diagnosis not present

## 2016-02-28 NOTE — Patient Instructions (Signed)
We will schedule here for a chest x-ray to evaluate for interstitial lung disease.  Please follow up in the sleep clinic for evaluation of her sleep apnea. Continue using her albuterol rescue inhaler.  Follow-up in 3 months.

## 2016-02-28 NOTE — Progress Notes (Signed)
Subjective:    Patient ID: Melvin Crawford, male    DOB: 1961/09/11, 55 y.o.   MRN: [id number]  HPI Consult for evaluation of dyspnea.  Melvin Crawford is a 55 year old with possible history of hypertension, GERD. He has complains of dyspnea for the past 3 years. He mostly has wheezing at night associated with cough and white sputum production. He also has dyspnea on exertion. He's had an acute exacerbation the past month and was treated with antibiotic and steroids. He is feeling back to his baseline now. He was evaluated by pulmonary at [facility] in the past with mild abnormalities on PFTs. He is only on albuterol rescue inhaler which seems to help with his symptoms.  He has a diagnosis of mild OSA diagnosed in 2013 but never started on CPAP or had a CPAP titration. He continues to have significant snoring, daytime somnolence, fatigue.  DATA: PFTs 11/23/14 FVC 3.19 (69%) FEV1 2.76 [74%] F/F 81 TLC 78 DLCO 84% Mild restrictive lung disease. No obstruction or DLCO impairment. No bronchodilator response.  Sleep study 11/11/3 Mild OSA, AHI 12  Social History: He smoked about 1 pack per week for 2 years. He quit 26 years ago. No alcohol, drug use. 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 No exposures at work or at home.  Family History: Heart disease-father, mother Prostate cancer-father  Past Medical History  Diagnosis Date  . Hypertension   . GERD (gastroesophageal reflux disease)     Current outpatient prescriptions:  .  albuterol (PROVENTIL HFA;VENTOLIN HFA) 108 (90 Base) MCG/ACT inhaler, Inhale 2 puffs into the lungs every 6 (six) hours as needed for wheezing or shortness of breath., Disp: , Rfl:  .  aspirin 81 MG tablet, Take 81 mg by mouth daily., Disp: , Rfl:  .  Cholecalciferol (VITAMIN D) 2000 UNITS CAPS, Take 1 capsule by mouth daily., Disp: , Rfl:  .  Cyanocobalamin (B-12) 1000 MCG CAPS, Take 1,200 mg by mouth., Disp: , Rfl:  .  losartan (COZAAR) 100 MG  tablet, Take 100 mg by mouth daily., Disp: , Rfl:  .  nabumetone (RELAFEN) 750 MG tablet, Take 750 mg by mouth 2 (two) times daily as needed for moderate pain., Disp: , Rfl:  .  Omega-3 Fatty Acids (FISH OIL) 1200 MG CAPS, Take 1 capsule by mouth 2 (two) times daily., Disp: , Rfl:  .  omeprazole (PRILOSEC) 40 MG capsule, Take 40 mg by mouth daily., Disp: , Rfl:   Review of Systems Cough, dyspnea, wheezing, no sputum, hemoptysis. No chest pain, palpitation. No fevers, chills, malaise, fatigue, loss of weight or loss of appetite. No nausea, vomiting, diarrhea, constipation. All other review of systems are negative.    Objective:   Physical Exam Blood pressure 138/78, pulse 75, height  (1.753 m), weight 214 lb (97.07 kg), SpO2 96 %. Gen: No apparent distress Neuro: No gross focal deficits. HEENT: No JVD, lymphadenopathy, thyromegaly. RS: Clear, No wheeze or crackles CVS: S1-S2 heard, no murmurs rubs gallops. Abdomen: Soft, positive bowel sounds. Musculoskeletal: No edema.    Assessment & Plan:  Evaluation for dyspnea.  He may have reactive airway disease probably exacerbated by GERD. He is on adequate treatment with PPI. He is currently well controlled on just on albuterol inhaler and I do not feel that he'll need an additional long-term controller medication unless there is worsening of his symptoms. Review of his PFTs from 2015 showed only mild restriction. This is likely secondary to his obesity and body  habitus. I'll get a chest x-ray to make sure that there is no interstitial abnormalities. I have encouraged him to undergo a weight loss program with exercise.  Sleep apnea, not on CPAP treatment. He has a follow-up in the sleep clinic arranged for later this month.  Plan: - Continue albuterol inhaler - Weight loss and exercise - CXR - Follow up in sleep clinic.  Chilton GreathousePraveen Jovana Rembold MD [facility] Pager [phone] If no answer or after 3pm call:  [phone] 02/28/2016, 5:22 PM

## 2016-03-02 NOTE — Progress Notes (Signed)
Quick Note:  LMOM TCB x1. ______ 

## 2016-03-05 ENCOUNTER — Telehealth: Payer: Self-pay | Admitting: Pulmonary Disease

## 2016-03-05 NOTE — Progress Notes (Signed)
Quick Note:  See PN dated 03/05/16 ______

## 2016-03-05 NOTE — Telephone Encounter (Signed)
Result Note     Please let the pt know that the CXR is normal   Spoke with pt's spouse and notified of results per Dr. Isaiah SergeMannam. She verbalized understanding and denied any questions.

## 2016-03-27 ENCOUNTER — Encounter: Payer: Self-pay | Admitting: Pulmonary Disease

## 2016-03-27 ENCOUNTER — Ambulatory Visit (INDEPENDENT_AMBULATORY_CARE_PROVIDER_SITE_OTHER): Payer: No Typology Code available for payment source | Admitting: Pulmonary Disease

## 2016-03-27 VITALS — BP 128/78 | HR 59 | Ht 69.0 in | Wt 211.0 lb

## 2016-03-27 DIAGNOSIS — R0609 Other forms of dyspnea: Secondary | ICD-10-CM | POA: Diagnosis not present

## 2016-03-27 DIAGNOSIS — G4733 Obstructive sleep apnea (adult) (pediatric): Secondary | ICD-10-CM | POA: Insufficient documentation

## 2016-03-27 DIAGNOSIS — K219 Gastro-esophageal reflux disease without esophagitis: Secondary | ICD-10-CM

## 2016-03-27 DIAGNOSIS — Z6832 Body mass index (BMI) 32.0-32.9, adult: Secondary | ICD-10-CM | POA: Insufficient documentation

## 2016-03-27 DIAGNOSIS — R0600 Dyspnea, unspecified: Secondary | ICD-10-CM

## 2016-03-27 DIAGNOSIS — E669 Obesity, unspecified: Secondary | ICD-10-CM

## 2016-03-27 HISTORY — DX: Other forms of dyspnea: R06.09

## 2016-03-27 HISTORY — DX: Obesity, unspecified: E66.9

## 2016-03-27 HISTORY — DX: Dyspnea, unspecified: R06.00

## 2016-03-27 HISTORY — DX: Body mass index (BMI) 32.0-32.9, adult: Z68.32

## 2016-03-27 HISTORY — DX: Obstructive sleep apnea (adult) (pediatric): G47.33

## 2016-03-27 NOTE — Assessment & Plan Note (Signed)
Weight reduction 

## 2016-03-27 NOTE — Patient Instructions (Signed)
1. We will order you an autocpap 5-15 cm H2O.  2. Give Melvin Crawford a call if her having issues with her CPAP machine.   Return to clinic in 2-3 mos.

## 2016-03-27 NOTE — Assessment & Plan Note (Signed)
Weight reduction. Cont PPI 

## 2016-03-27 NOTE — Assessment & Plan Note (Signed)
Patient was diagnosed with sleep apnea back in 2013. [place] Results were reviewed with the patient. His AHI was 12. Has mild sleep apnea. Was not treated as he was not too symptomatic.  Since that time, he is gained 20 pounds. Also gets sleepy June the daytime. Has hypersomnia, snoring, witnessed apneas, gasping, choking. Naps during the daytime. Hypersomnia affects functionality. Denies abnormal behavior and sleep. Wife was concerned  about witnessed apneas so she made this appointment. ESS 13.   Plan : 1. Plan for auto CPAP 5-15 cm water. Need to make sure he has good correction. If download is not good, she'll probably need an in lab study for titration. Might need BiPAP. Has gained 20 pounds since sleep study in 2013. 2. Wife will also need sleep study.  3. Anticipate no issues with CPAP. 4. Sleep hygiene.

## 2016-03-27 NOTE — Progress Notes (Signed)
Subjective:    Patient ID: Melvin Crawford, male    DOB: 26-Dec-1960, 55 y.o.   MRN: [id number]  HPI   This is the case of Melvin Crawford, 55 y.o. Male, who is being seen today per his wife's request for OSA.   As you very well know, patient was diagnosed with sleep apnea back in 2013. He had a sleep study done in [place]. Results were reviewed with the patient. His AHI was 12. Has mild sleep apnea. Was not treated as he was not too symptomatic.  Since that time, he is gained 20 pounds. Also gets sleepy June the daytime. Has hypersomnia, snoring, witnessed apneas, gasping, choking. Naps during the daytime. Hypersomnia affects functionality. Denies abnormal behavior and sleep. Wife was concerned  about witnessed apneas so she made this appointment. ESS 13.   His dyspnea is also better. He has used albuterol maybe once the last 2 months.     Review of Systems  Constitutional: Negative.  Negative for fever and unexpected weight change.  HENT: Positive for congestion, postnasal drip, sinus pressure and sore throat. Negative for dental problem, ear pain, nosebleeds, rhinorrhea, sneezing and trouble swallowing.   Eyes: Positive for redness and itching.  Respiratory: Positive for cough and shortness of breath. Negative for chest tightness and wheezing.   Cardiovascular: Negative.  Negative for palpitations and leg swelling.  Gastrointestinal: Negative.  Negative for nausea and vomiting.  Endocrine: Negative.   Genitourinary: Negative.  Negative for dysuria.  Musculoskeletal: Negative.  Negative for joint swelling.  Skin: Negative.  Negative for rash.  Allergic/Immunologic: Negative.   Neurological: Positive for headaches.  Hematological: Negative.  Does not bruise/bleed easily.  Psychiatric/Behavioral: Negative.  Negative for dysphoric mood. The patient is not nervous/anxious.    Past Medical History  Diagnosis Date  . Hypertension   . GERD (gastroesophageal reflux disease)      No family history on file.   Past Surgical History  Procedure Laterality Date  . Colonoscopy  2012  . Incision and drainage Right 12/03/2013    Procedure: INCISION AND DRAINAGE RIGHT LONG FINGER ;  Surgeon: Tami RibasKevin R Kuzma, MD;  Location: [facility];  Service: Orthopedics;  Laterality: Right;    Social History   Social History  . Marital Status: Married    Spouse Name: N/A  . Number of Children: N/A  . Years of Education: N/A   Occupational History  . Not on file.   Social History Main Topics  . Smoking status: Never Smoker   . Smokeless tobacco: Never Used  . Alcohol Use: No  . Drug Use: No  . Sexual Activity: Not on file   Other Topics Concern  . Not on file   Social History Narrative     Allergies  Allergen Reactions  . Atorvastatin Other (See Comments)    hives  . Codeine Nausea And Vomiting     Outpatient Prescriptions Prior to Visit  Medication Sig Dispense Refill  . albuterol (PROVENTIL HFA;VENTOLIN HFA) 108 (90 Base) MCG/ACT inhaler Inhale 2 puffs into the lungs every 6 (six) hours as needed for wheezing or shortness of breath.    Marland Kitchen. aspirin 81 MG tablet Take 81 mg by mouth daily.    . Cholecalciferol (VITAMIN D) 2000 UNITS CAPS Take 1 capsule by mouth daily.    . Cyanocobalamin (B-12) 1000 MCG CAPS Take 1,200 mg by mouth.    . losartan (COZAAR) 100 MG tablet Take 100 mg by mouth daily.    .Marland Kitchen  nabumetone (RELAFEN) 750 MG tablet Take 750 mg by mouth 2 (two) times daily as needed for moderate pain.    . Omega-3 Fatty Acids (FISH OIL) 1200 MG CAPS Take 1 capsule by mouth 2 (two) times daily.    Marland Kitchen omeprazole (PRILOSEC) 40 MG capsule Take 40 mg by mouth daily.     No facility-administered medications prior to visit.   No orders of the defined types were placed in this encounter.           Objective:   Physical Exam   Vitals:  Filed Vitals:   03/27/16 1556  BP: 128/78  Pulse: 59  Height: 5\' 9"  (1.753 m)  Weight: 211 lb (95.709 kg)    SpO2: 93%    Constitutional/General:  Pleasant, well-nourished, well-developed, not in any distress,  Comfortably seating.  Well kempt  Body mass index is 31.15 kg/(m^2). Wt Readings from Last 3 Encounters:  03/27/16 211 lb (95.709 kg)  02/28/16 214 lb (97.07 kg)  12/03/13 208 lb 2 oz (94.405 kg)    Neck circumference:  17 inches  HEENT: Pupils equal and reactive to light and accommodation. Anicteric sclerae. Normal nasal mucosa.   No oral  lesions,  mouth clear,  oropharynx clear, no postnasal drip. (-) Oral thrush. No dental caries.  Airway - Mallampati class III  Neck: No masses. Midline trachea. No JVD, (-) LAD. (-) bruits appreciated.  Respiratory/Chest: Grossly normal chest. (-) deformity. (-) Accessory muscle use.  Symmetric expansion. (-) Tenderness on palpation.  Resonant on percussion.  Diminished BS on both lower lung zones. (-) wheezing, crackles, rhonchi (-) egophony  Cardiovascular: Regular rate and  rhythm, heart sounds normal, no murmur or gallops, no peripheral edema  Gastrointestinal:  Normal bowel sounds. Soft, non-tender. No hepatosplenomegaly.  (-) masses.   Musculoskeletal:  Normal muscle tone. Normal gait.   Extremities: Grossly normal. (-) clubbing, cyanosis.  (-) edema  Skin: (-) rash,lesions seen.   Neurological/Psychiatric : alert, oriented to time, place, person. Normal mood and affect           Assessment & Plan:  OSA (obstructive sleep apnea) Patient was diagnosed with sleep apnea back in 2013. He had a sleep study done in [place]. Results were reviewed with the patient. His AHI was 12. Has mild sleep apnea. Was not treated as he was not too symptomatic.  Since that time, he is gained 20 pounds. Also gets sleepy June the daytime. Has hypersomnia, snoring, witnessed apneas, gasping, choking. Naps during the daytime. Hypersomnia affects functionality. Denies abnormal behavior and sleep. Wife was concerned  about witnessed  apneas so she made this appointment. ESS 13.   Plan : 1. Plan for auto CPAP 5-15 cm water. Need to make sure he has good correction. If download is not good, she'll probably need an in lab study for titration. Might need BiPAP. Has gained 20 pounds since sleep study in 2013. 2. Wife will also need sleep study.  3. Anticipate no issues with CPAP. 4. Sleep hygiene.    Exertional dyspnea SOB better. Might be related to obesity. PFT in 10/2014 RVD. N DLCO. CXR (12/2015) no active dse.   Cont alb prn. Sees Dr.Mannam.   GERD (gastroesophageal reflux disease) Weight reduction. Cont PPI  Obesity Weight reduction       Patient will follow up with me in 6 weeks.     Pollie Meyer, MD 03/27/2016   4:46 PM Pulmonary and Critical Care Medicine [facility] Pager: [phone]  Office: [phone], Fax: [phone]

## 2016-03-27 NOTE — Assessment & Plan Note (Signed)
SOB better. Might be related to obesity. PFT in 10/2014 RVD. N DLCO. CXR (12/2015) no active dse.   Cont alb prn. Sees Dr.Mannam.

## 2016-06-04 ENCOUNTER — Ambulatory Visit: Payer: No Typology Code available for payment source | Admitting: Pulmonary Disease

## 2016-06-28 ENCOUNTER — Ambulatory Visit (INDEPENDENT_AMBULATORY_CARE_PROVIDER_SITE_OTHER): Payer: No Typology Code available for payment source | Admitting: Pulmonary Disease

## 2016-06-28 DIAGNOSIS — J4520 Mild intermittent asthma, uncomplicated: Secondary | ICD-10-CM | POA: Diagnosis not present

## 2016-06-28 DIAGNOSIS — E669 Obesity, unspecified: Secondary | ICD-10-CM | POA: Diagnosis not present

## 2016-06-28 DIAGNOSIS — R0609 Other forms of dyspnea: Secondary | ICD-10-CM

## 2016-06-28 DIAGNOSIS — G4733 Obstructive sleep apnea (adult) (pediatric): Secondary | ICD-10-CM

## 2016-06-28 DIAGNOSIS — K219 Gastro-esophageal reflux disease without esophagitis: Secondary | ICD-10-CM | POA: Diagnosis not present

## 2016-06-28 DIAGNOSIS — J45909 Unspecified asthma, uncomplicated: Secondary | ICD-10-CM | POA: Insufficient documentation

## 2016-06-28 MED ORDER — BUDESONIDE-FORMOTEROL FUMARATE 160-4.5 MCG/ACT IN AERO: 2.0000 | INHALATION_SPRAY | Freq: Two times a day (BID) | RESPIRATORY_TRACT | 6 refills | Status: DC

## 2016-06-28 NOTE — Progress Notes (Signed)
Subjective:    Patient ID: Melvin Crawford, male    DOB: 1960-12-17, 55 y.o.   MRN: [id number]  HPI   This is the case of Durell Lofaso, 55 y.o. Male, who is being seen today per his wife's request for OSA.   As you very well know, patient was diagnosed with sleep apnea back in 2013. He had a sleep study done in [place]. Results were reviewed with the patient. His AHI was 12. Has mild sleep apnea. Was not treated as he was not too symptomatic.  Since that time, he is gained 20 pounds. Also gets sleepy June the daytime. Has hypersomnia, snoring, witnessed apneas, gasping, choking. Naps during the daytime. Hypersomnia affects functionality. Denies abnormal behavior and sleep. Wife was concerned  about witnessed apneas so she made this appointment. ESS 13.   His dyspnea is also better. He has used albuterol maybe once the last 2 months.  ROV 06/28/2016 Pt returns to office as f/u on his OSA. Since last seen, he states he is using his CPAP and feels better using it. More energy. Less sleepiness. No issues with it. Download since May, AHI 0.7, 79%.  Pts "asthma" has also been flaring up. Received Z-Pak and prednisone last month. Initially got better but has been using albuterol almost daily to last 2-3 days.  Review of Systems  Constitutional: Negative.  Negative for fever and unexpected weight change.  HENT: Positive for congestion, postnasal drip, sinus pressure and sore throat. Negative for dental problem, ear pain, nosebleeds, rhinorrhea, sneezing and trouble swallowing.   Eyes: Positive for redness and itching.  Respiratory: Positive for cough and shortness of breath. Negative for chest tightness and wheezing.   Cardiovascular: Negative.  Negative for palpitations and leg swelling.  Gastrointestinal: Negative.  Negative for nausea and vomiting.  Endocrine: Negative.   Genitourinary: Negative.  Negative for dysuria.  Musculoskeletal: Negative.  Negative for joint swelling.  Skin:  Negative.  Negative for rash.  Allergic/Immunologic: Negative.   Neurological: Positive for headaches.  Hematological: Negative.  Does not bruise/bleed easily.  Psychiatric/Behavioral: Negative.  Negative for dysphoric mood. The patient is not nervous/anxious.        Objective:   Physical Exam   Vitals:  Vitals:   06/28/16 1531  BP: 128/78  Pulse: 71  SpO2: 93%  Weight: 216 lb (98 kg)  Height:  (1.753 m)    Constitutional/General:  Pleasant, well-nourished, well-developed, not in any distress,  Comfortably seating.  Well kempt  Body mass index is 31.9 kg/m. Wt Readings from Last 3 Encounters:  06/28/16 216 lb (98 kg)  03/27/16 211 lb (95.7 kg)  02/28/16 214 lb (97.1 kg)    Neck circumference:  17 inches  HEENT: Pupils equal and reactive to light and accommodation. Anicteric sclerae. Normal nasal mucosa.   No oral  lesions,  mouth clear,  oropharynx clear, no postnasal drip. (-) Oral thrush. No dental caries.  Airway - Mallampati class III  Neck: No masses. Midline trachea. No JVD, (-) LAD. (-) bruits appreciated.  Respiratory/Chest: Grossly normal chest. (-) deformity. (-) Accessory muscle use.  Symmetric expansion. (-) Tenderness on palpation.  Resonant on percussion.  Diminished BS on both lower lung zones. (-) crackles, rhonchi.  Wheeze in LLL.  (-) egophony  Cardiovascular: Regular rate and  rhythm, heart sounds normal, no murmur or gallops, no peripheral edema  Gastrointestinal:  Normal bowel sounds. Soft, non-tender. No hepatosplenomegaly.  (-) masses.   Musculoskeletal:  Normal muscle tone. Normal gait.  Extremities: Grossly normal. (-) clubbing, cyanosis.  (-) edema  Skin: (-) rash,lesions seen.   Neurological/Psychiatric : alert, oriented to time, place, person. Normal mood and affect           Assessment & Plan:  OSA (obstructive sleep apnea) Patient was diagnosed with sleep apnea back in 2013. He had a sleep study done in  [place]. Results were reviewed with the patient. His AHI was 12. Has mild sleep apnea. Was not treated as he was not too symptomatic.  Since that time, he is gained 20 pounds. Also gets sleepy June the daytime. Has hypersomnia, snoring, witnessed apneas, gasping, choking. Naps during the daytime. Hypersomnia affects functionality. Denies abnormal behavior and sleep. Wife was concerned  about witnessed apneas so she made this appointment. ESS 13.   Started on autocpap. 5-15 cm water. Feels better using it. More energy. Less sleepiness. Feels benefit of cpap. DL for 3 mos > 38%, AHI 0.7  Plan : We extensively discussed the importance of treating OSA and the need to use PAP therapy.   Continue with autocpap 5-15 cm water.    Patient was instructed to have mask, tubings, filter, reservoir cleaned at least once a week with soapy water.  Patient was instructed to call the office if he/she is having issues with the PAP device.    I advised patient to obtain sufficient amount of sleep --  7 to 8 hours at least in a 24 hr period.  Patient was advised to follow good sleep hygiene.  Patient was advised NOT to engage in activities requiring concentration and/or vigilance if he/she is and  sleepy.  Patient is NOT to drive if he/she is sleepy.     GERD (gastroesophageal reflux disease) Weight reduction. Cont PPI  Obesity Weight reduction  Exertional dyspnea Recent flare up of SOB related to weather. Likely has asthma.  PFT in 10/2014 RVD. N DLCO. CXR (12/2015) no active dse.   Cont alb prn. Start symbicort 160/4.5 2P BID if not better in 1-2 days.   Asthma Recent SOB with change in weather. Has some wheezing. Cont with alb pr.  Start symbicort 160/4.5 2P BID if not better in 1-2 days.  May need prednisone again. Pt will call if not better.       Patient will follow up with me in 6 mos.     Pollie Meyer, MD 06/28/2016   4:01 PM Pulmonary and Critical Care  Medicine [facility] Pager: [phone] Office: [phone], Fax: [phone]

## 2016-06-28 NOTE — Patient Instructions (Signed)
  It was a pleasure taking care of you today!  Continue using your CPAP machine for your sleep apnea.   Please make sure you use your CPAP device everytime you sleep.  We will monitor the usage of your machine per your insurance requirement.  Your insurance company may take the machine from you if you are not using it regularly.   Please clean the mask, tubings, filter, water reservoir with soapy water every week.  Please use distilled water for the water reservoir.   Please call the office or your machine provider (DME company) if you are having issues with the device.    You are diagnosed with Asthma.   Asthma is a chronic disease that affects the airways of your lungs.When you have asthma, your airways become swollen. The swelling causes the airways to make thick, sticky secretions called mucus.   Asthma also causes the muscles in and around your airways to get very tight.  This swelling, mucus, and tight muscles can make your airways narrower than normal and it becomes very hard for you to get air into and out of your lungs.   Sometimes, when you have a lung infection, this can make your breathing worse, and will cause you to  have an asthma flare-up. Please call your primary care doctor or the office if you are having an asthma flare-up.   Smoking makes asthma worse.   Make sure you use your medications for asthma -- Maintenance medications : Start Symbicort 160/4.5 2 puffs 2x/day if breathing is not better in 1-2 days.   Rescue medications: Albuterol 2 puffs every 4 hours as needed for shortness of breath.   Please rinse your mouth each time you use your maintenance medication.   Return to clinic in  6 months.      Please call the office if you are having issues with your medications

## 2016-06-28 NOTE — Assessment & Plan Note (Addendum)
Recent flare up of SOB related to weather. Likely has asthma.  PFT in 10/2014 RVD. N DLCO. CXR (12/2015) no active dse.   Cont alb prn. Start symbicort 160/4.5 2P BID if not better in 1-2 days.

## 2016-06-28 NOTE — Progress Notes (Signed)
Patient seen in the office today and instructed on use of Symbicort.  Patient expressed understanding and demonstrated technique. ° °

## 2016-06-28 NOTE — Assessment & Plan Note (Signed)
Recent SOB with change in weather. Has some wheezing. Cont with alb pr.  Start symbicort 160/4.5 2P BID if not better in 1-2 days.  May need prednisone again. Pt will call if not better.

## 2016-06-28 NOTE — Assessment & Plan Note (Signed)
Weight reduction 

## 2016-06-28 NOTE — Assessment & Plan Note (Signed)
Patient was diagnosed with sleep apnea back in 2013. He had a sleep study done in [place]. Results were reviewed with the patient. His AHI was 12. Has mild sleep apnea. Was not treated as he was not too symptomatic.  Since that time, he is gained 20 pounds. Also gets sleepy June the daytime. Has hypersomnia, snoring, witnessed apneas, gasping, choking. Naps during the daytime. Hypersomnia affects functionality. Denies abnormal behavior and sleep. Wife was concerned  about witnessed apneas so she made this appointment. ESS 13.   Started on autocpap. 5-15 cm water. Feels better using it. More energy. Less sleepiness. Feels benefit of cpap. DL for 3 mos > 28%, AHI 0.7  Plan : We extensively discussed the importance of treating OSA and the need to use PAP therapy.   Continue with autocpap 5-15 cm water.    Patient was instructed to have mask, tubings, filter, reservoir cleaned at least once a week with soapy water.  Patient was instructed to call the office if he/she is having issues with the PAP device.    I advised patient to obtain sufficient amount of sleep --  7 to 8 hours at least in a 24 hr period.  Patient was advised to follow good sleep hygiene.  Patient was advised NOT to engage in activities requiring concentration and/or vigilance if he/she is and  sleepy.  Patient is NOT to drive if he/she is sleepy.

## 2016-06-28 NOTE — Assessment & Plan Note (Signed)
Weight reduction. Cont PPI

## 2016-07-13 ENCOUNTER — Encounter: Payer: Self-pay | Admitting: Pulmonary Disease

## 2017-06-26 ENCOUNTER — Ambulatory Visit: Payer: No Typology Code available for payment source | Admitting: Adult Health

## 2017-07-02 NOTE — Progress Notes (Signed)
[facility]   Chief Complaint  Patient presents with  . Follow-up    Pt states that his CPAP has been working well for him other than still trying to get used to the face mask. Pt states that if he moves, he will have some mild leaks but then will adjust mask and quickly be able to go right back to sleep. DME: [facility].     Primary Pulmonologist: Dr. Isaiah Serge (previously seen by Dr. Christene Slates)  Current Outpatient Prescriptions on File Prior to Visit  Medication Sig  . albuterol (PROVENTIL HFA;VENTOLIN HFA) 108 (90 Base) MCG/ACT inhaler Inhale 2 puffs into the lungs every 6 (six) hours as needed for wheezing or shortness of breath.  Marland Kitchen aspirin 81 MG tablet Take 162 mg by mouth daily.   . Cholecalciferol (VITAMIN D) 2000 UNITS CAPS Take 1 capsule by mouth daily.  . Cyanocobalamin (B-12) 1000 MCG CAPS Take 1,200 mg by mouth.  . losartan (COZAAR) 100 MG tablet Take 100 mg by mouth daily.  . nabumetone (RELAFEN) 750 MG tablet Take 750 mg by mouth 2 (two) times daily as needed for moderate pain.  . Omega-3 Fatty Acids (FISH OIL) 1200 MG CAPS Take 1 capsule by mouth 2 (two) times daily.  Marland Kitchen omeprazole (PRILOSEC) 40 MG capsule Take 40 mg by mouth daily.   No current facility-administered medications on file prior to visit.    Events:  ROV 06/28/2016 >> office f/u for OSA, on CPAP / improved.  Download since May, AHI 0.7, 79%.  Pts "asthma" has also been flaring up. Received Z-Pak and prednisone last month. Initially got better but has been using albuterol almost daily to last 2-3 days.  Studies: Spirometry 10/2014 >> FEV1/FVC 86, FVC 3.19 / 69%, FEV1 2.76 / 74%, TLC 5.28 / 78%  Past Medical Hx:  has a past medical history of GERD (gastroesophageal reflux disease) and Hypertension.   Past Surgical hx, Allergies, Family hx, Social hx all reviewed.  Vital Signs BP 140/80 (BP Location: Left Arm, Patient Position: Sitting, Cuff Size: Normal)   Pulse (!) 56   Ht 5\' 9"  (1.753 m)   Wt 217 lb  12.8 oz (98.8 kg)   SpO2 96%   BMI 32.16 kg/m   History of Present Illness Melvin Crawford is a 56 y.o. male, former smoker (1pk per week x 2 years, quit in 1980) with a history of obesity, GERD, OSA (Sleep study 2013 in [place], AHI 12, mild sleep apnea) on CPAP who presented to the pulmonary office for routine follow up of OSA.  He was initially seen by Dr. Christene Slates for OSA in 06/2016 with an increase in hypersomnia, snoring, witnessed apneas, gasping / choking.  He was started on CPAP at that time.    He reports compliance with his CPAP.  Only misses occasionally if he is out of town.  He reports he has had increased nasal drainage this spring / summer and has used his proair for SOB (up to 4x per day).  Reports he feels he is a little more short of breath with exertion but relates this to weight gain (thinks he has gained 10-12 lbs in the last year (attributes to his wife's retirement and she is cooking more).  States he gets a new mask every month, followed by [facility].  Feels he is doing so much better on CPAP.  Notes snoring decreased, feels better, stopped taking daytime naps, energy level improved.  Wife supports his statements.  Physical Exam General - well developed adult M in no acute distress ENT - No sinus tenderness, no oral exudate, no LAN Cardiac - s1s2 regular, no murmur Chest - even/non-labored, lungs bilaterally clear. No wheeze/rales Back - No focal tenderness Abd - Soft, non-tender Ext - No edema Neuro - Normal strength Skin - No rashes Psych - normal mood, and behavior   Assessment/Plan  Discussion: 56 y/o M with PMH of obesity, GERD, OSA on CPAP with excellent compliance - AHI 0.6, usage 29/30 days with 26 days > 4 hours use).     OSA on CPAP  Seasonal Allergies   Plan: CPAP compliance encouraged > on autocpap 5-15 cm H2) Encouraged mask, tubing, filter / reservoir care once per week with soapy water Encouraged alcohol avoidance / sedating medications /  good sleep hygiene  Discussed use of OTC Rhinocort for nasal symptoms  Encouraged healthy eating habits, walking  Return for OSA follow up in one year or sooner if new needs arise    Patient Instructions  1.  You are doing fantastic with your CPAP!  Keep up the good work! 2.  Try Rhinocort (budesonide) 1 spray daily in each nostril for your nasal symptoms 3.  Return to see Dr. Vassie LollAlva in one year to follow up sleep follow up 4.  Continue to walk daily  5.  Use your albuterol inhaler as needed, please call if you have new or worsening symptoms 6.  Please call if you new needs arise.     Canary BrimBrandi Esme Freund, NP-C [facility]  [phone] 07/04/2017, 12:57 PM

## 2017-07-03 ENCOUNTER — Encounter: Payer: Self-pay | Admitting: Pulmonary Disease

## 2017-07-03 ENCOUNTER — Ambulatory Visit (INDEPENDENT_AMBULATORY_CARE_PROVIDER_SITE_OTHER): Payer: No Typology Code available for payment source | Admitting: Pulmonary Disease

## 2017-07-03 VITALS — BP 140/80 | HR 56 | Ht 69.0 in | Wt 217.8 lb

## 2017-07-03 DIAGNOSIS — G4733 Obstructive sleep apnea (adult) (pediatric): Secondary | ICD-10-CM | POA: Diagnosis not present

## 2017-07-03 DIAGNOSIS — R0609 Other forms of dyspnea: Secondary | ICD-10-CM | POA: Diagnosis not present

## 2017-07-03 NOTE — Patient Instructions (Addendum)
1.  You are doing fantastic with your CPAP!  Keep up the good work! 2.  Try Rhinocort (budesonide) 1 spray daily in each nostril for your nasal symptoms 3.  Return to see Dr. Vassie LollAlva in one year to follow up sleep follow up 4.  Continue to walk daily  5.  Use your albuterol inhaler as needed, please call if you have new or worsening symptoms 6.  Please call if you new needs arise.

## 2018-06-13 ENCOUNTER — Ambulatory Visit: Payer: No Typology Code available for payment source | Admitting: Adult Health

## 2018-06-13 ENCOUNTER — Encounter: Payer: Self-pay | Admitting: Adult Health

## 2018-06-13 ENCOUNTER — Ambulatory Visit (INDEPENDENT_AMBULATORY_CARE_PROVIDER_SITE_OTHER)
Admission: RE | Admit: 2018-06-13 | Discharge: 2018-06-13 | Disposition: A | Discharge status: Home / Self Care | Payer: No Typology Code available for payment source | Source: Ambulatory Visit | Attending: Adult Health | Admitting: Adult Health

## 2018-06-13 ENCOUNTER — Other Ambulatory Visit (INDEPENDENT_AMBULATORY_CARE_PROVIDER_SITE_OTHER): Payer: No Typology Code available for payment source

## 2018-06-13 VITALS — BP 126/82 | HR 75 | Temp 97.1°F | Ht 70.0 in | Wt 221.6 lb

## 2018-06-13 DIAGNOSIS — G4733 Obstructive sleep apnea (adult) (pediatric): Secondary | ICD-10-CM | POA: Diagnosis not present

## 2018-06-13 DIAGNOSIS — J4531 Mild persistent asthma with (acute) exacerbation: Secondary | ICD-10-CM

## 2018-06-13 LAB — CBC WITH DIFFERENTIAL/PLATELET
Basophils Absolute: 0.1 10*3/uL (ref 0.0–0.1)
Basophils Relative: 0.8 % (ref 0.0–3.0)
EOS PCT: 12.5 % — AB (ref 0.0–5.0)
Eosinophils Absolute: 0.8 10*3/uL — ABNORMAL HIGH (ref 0.0–0.7)
HCT: 47.8 % (ref 39.0–52.0)
Hemoglobin: 16.4 g/dL (ref 13.0–17.0)
LYMPHS ABS: 1.1 10*3/uL (ref 0.7–4.0)
Lymphocytes Relative: 16.8 % (ref 12.0–46.0)
MCHC: 34.2 g/dL (ref 30.0–36.0)
MCV: 91.7 fl (ref 78.0–100.0)
MONO ABS: 0.7 10*3/uL (ref 0.1–1.0)
MONOS PCT: 10.9 % (ref 3.0–12.0)
Neutro Abs: 3.9 10*3/uL (ref 1.4–7.7)
Neutrophils Relative %: 59 % (ref 43.0–77.0)
Platelets: 204 10*3/uL (ref 150.0–400.0)
RBC: 5.22 Mil/uL (ref 4.22–5.81)
RDW: 14.1 % (ref 11.5–15.5)
WBC: 6.6 10*3/uL (ref 4.0–10.5)

## 2018-06-13 LAB — NITRIC OXIDE: Nitric Oxide: 228

## 2018-06-13 MED ORDER — BUDESONIDE-FORMOTEROL FUMARATE 160-4.5 MCG/ACT IN AERO: 2.0000 | INHALATION_SPRAY | Freq: Two times a day (BID) | RESPIRATORY_TRACT | 3 refills | Status: DC

## 2018-06-13 MED ORDER — BUDESONIDE-FORMOTEROL FUMARATE 160-4.5 MCG/ACT IN AERO: 2.0000 | INHALATION_SPRAY | Freq: Two times a day (BID) | RESPIRATORY_TRACT | 0 refills | Status: DC

## 2018-06-13 MED ORDER — LEVALBUTEROL HCL 0.63 MG/3ML IN NEBU
0.6300 mg | INHALATION_SOLUTION | Freq: Once | RESPIRATORY_TRACT | Status: AC
  Administered 2018-06-13: 0.63 mg via RESPIRATORY_TRACT

## 2018-06-13 MED ORDER — ALBUTEROL SULFATE HFA 108 (90 BASE) MCG/ACT IN AERS: 2.0000 | INHALATION_SPRAY | RESPIRATORY_TRACT | 3 refills | Status: DC | PRN

## 2018-06-13 MED ORDER — PREDNISONE 10 MG PO TABS: ORAL_TABLET | ORAL | 0 refills | Status: DC

## 2018-06-13 NOTE — Progress Notes (Signed)
Patient seen in the office today and instructed on use of Symbicort 160.  Patient expressed understanding and demonstrated technique. Melvin Crawford, [facility] 06/13/18

## 2018-06-13 NOTE — Patient Instructions (Signed)
Begin Zyrtec 10 mg at bedtime Prednisone taper over the next week Chest x-ray today Labs today Begin Symbicort 160 2 puffs twice daily, rinse after use ProAir 2 puffs every 4hr as needed wheezing .  Follow up Dr. Isaiah SergeMannam in 4 weeks and As needed   Please contact office for sooner follow up if symptoms do not improve or worsen or seek emergency care

## 2018-06-13 NOTE — Assessment & Plan Note (Signed)
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  Very high Exhaled nitric oxide testing .  Check cbc w/ diff, RAST/IgE  Add zyrtec  Prednisone taper  xopenex neb x 1 in office  Begin ICS/LABA inhaler .   Plan  Patient Instructions  Begin Zyrtec 10 mg at bedtime Prednisone taper over the next week Chest x-ray today Labs today Begin Symbicort 160 2 puffs twice daily, rinse after use ProAir 2 puffs every 4hr as needed wheezing .  Follow up Dr. Isaiah SergeMannam in 4 weeks and As needed   Please contact office for sooner follow up if symptoms do not improve or worsen or seek emergency care

## 2018-06-13 NOTE — Progress Notes (Signed)
 @Patient  ID: Melvin Crawford, male    DOB: 1961/03/26, 57 y.o.   MRN: [id number]  Chief Complaint  Patient presents with  . Acute Visit    Cough     Referring provider: Gordan PaymentGrisso, Greg A., MD  HPI: 57 year old male former smoker followed for obstructive sleep apnea and suspected RAD      Studies: PFTs 11/23/14 FVC 3.19 (69%) FEV1 2.76 [74%] F/F 81 TLC 78 DLCO 84% Mild restrictive lung disease. No obstruction or DLCO impairment. No bronchodilator response.   Sleep study 11/11/3 Mild OSA, AHI 12  06/13/2018 Acute OV : Cough  Pt presents for an acute office visit. Complains of 2 days of cough , sob, wheezing . Cough is dry .  Was exposed to chlorine gas at work 2 days ago, after exposure developed cough , wheezing, sob.  Patient had to increase his albuterol use over the last 2 days.  Patient says prior to this episode he was doing okay with no wheezing or coughing. 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  FENO today is 228 ppb .   He denies any chest pain, orthopnea, edema, abdominal pain, GERD.  Patient has sleep apnea.  Says he is been doing well on CPAP.  Download shows excellent compliance with average usage at 6.5 hours.  Patient is on auto CPAP 5 to 15 cm H2O.  AHI 0.5.  Positive leaks.   Allergies  Allergen Reactions  . Atorvastatin Other (See Comments)    hives  . Codeine Nausea And Vomiting    Immunization History  Administered Date(s) Administered  . Influenza Split 10/05/2015, 12/27/2017  . Influenza-Unspecified 09/26/2016  . Zoster 02/28/2015    Past Medical History:  Diagnosis Date  . GERD (gastroesophageal reflux disease)   . Hypertension     Tobacco History: Social History   Tobacco Use  Smoking Status Never Smoker  Smokeless Tobacco Never Used   Counseling given: Not Answered   Outpatient Medications Prior to Visit  Medication Sig Dispense Refill  . albuterol (PROVENTIL HFA;VENTOLIN HFA) 108 (90 Base) MCG/ACT inhaler Inhale 2 puffs  into the lungs every 6 (six) hours as needed for wheezing or shortness of breath.    Marland Kitchen. albuterol (PROVENTIL) (2.5 MG/3ML) 0.083% nebulizer solution Take 2.5 mg by nebulization every 4 (four) hours as needed for wheezing or shortness of breath.    Marland Kitchen. aspirin 81 MG tablet Take 162 mg by mouth daily.     . Cholecalciferol (VITAMIN D) 2000 UNITS CAPS Take 1 capsule by mouth daily.    . Cyanocobalamin (B-12) 1000 MCG CAPS Take 1,200 mg by mouth.    . losartan (COZAAR) 100 MG tablet Take 100 mg by mouth daily.    . nabumetone (RELAFEN) 750 MG tablet Take 750 mg by mouth 2 (two) times daily as needed for moderate pain.    . Omega-3 Fatty Acids (FISH OIL) 1200 MG CAPS Take 1 capsule by mouth 2 (two) times daily.    Marland Kitchen. omeprazole (PRILOSEC) 40 MG capsule Take 40 mg by mouth daily.     No facility-administered medications prior to visit.      Review of Systems  Constitutional:   No  weight loss, night sweats,  Fevers, chills, fatigue, or  lassitude.  HEENT:   No headaches,  Difficulty swallowing,  Tooth/dental problems, or  Sore throat,                No sneezing, itching, ear ache,  +nasal congestion, post nasal drip,  CV:  No chest pain,  Orthopnea, PND, swelling in lower extremities, anasarca, dizziness, palpitations, syncope.   GI  No heartburn, indigestion, abdominal pain, nausea, vomiting, diarrhea, change in bowel habits, loss of appetite, bloody stools.   Resp:   No chest wall deformity  Skin: no rash or lesions.  GU: no dysuria, change in color of urine, no urgency or frequency.  No flank pain, no hematuria   MS:  No joint pain or swelling.  No decreased range of motion.  No back pain.    Physical Exam  BP 126/82 (BP Location: Right Arm, Cuff Size: Normal)   Pulse 75   Temp (!) 97.1 F (36.2 C) (Oral)   Ht 5\' 10"  (1.778 m)   Wt 221 lb 9.6 oz (100.5 kg)   SpO2 97%   BMI 31.80 kg/m   GEN: A/Ox3; pleasant , NAD, obese    HEENT:  [place]/AT,  EACs-clear, TMs-wnl, NOSE-clear,  THROAT-clear, no lesions, no postnasal drip or exudate noted.   NECK:  Supple w/ fair ROM; no JVD; normal carotid impulses w/o bruits; no thyromegaly or nodules palpated; no lymphadenopathy.    RESP  Few trace wheezes , speaks in full sentences , ear  P & A; w/o, wheezes/ rales/ or rhonchi. no accessory muscle use, no dullness to percussion  CARD:  RRR, no m/r/g, no peripheral edema, pulses intact, no cyanosis or clubbing.  GI:   Soft & nt; nml bowel sounds; no organomegaly or masses detected.   Musco: Warm bil, no deformities or joint swelling noted.   Neuro: alert, no focal deficits noted.    Skin: Warm, no lesions or rashes    Lab Results:  CBC  No results found for: BNP  ProBNP No results found for: PROBNP  Imaging: No results found.   Assessment & Plan:   Asthma Exacerbation -occupational exposure trigger with chlorine gas.  Very high Exhaled nitric oxide testing .  Check cbc w/ diff, RAST/IgE  Add zyrtec  Prednisone taper  xopenex neb x 1 in office  Begin ICS/LABA inhaler .   Plan  Patient Instructions  Begin Zyrtec 10 mg at bedtime Prednisone taper over the next week Chest x-ray today Labs today Begin Symbicort 160 2 puffs twice daily, rinse after use ProAir 2 puffs every 4hr as needed wheezing .  Follow up Dr. Isaiah Serge in 4 weeks and As needed   Please contact office for sooner follow up if symptoms do not improve or worsen or seek emergency care      OSA (obstructive sleep apnea) Doing well on CPAP   Plan  Cont on CPAP      Rubye Oaks, NP 06/13/2018

## 2018-06-13 NOTE — Addendum Note (Signed)
Addended by: Boone MasterJONES, Rockey Guarino E on: 06/13/2018 11:07 AM   Modules accepted: Orders

## 2018-06-13 NOTE — Assessment & Plan Note (Signed)
Doing well on CPAP   Plan  Cont on CPAP  

## 2018-06-16 LAB — RESPIRATORY ALLERGY PROFILE REGION II ~~LOC~~
ASPERGILLUS FUMIGATUS M3: 0.11 kU/L — AB
Allergen, A. alternata, m6: <0.1 kU/L
Allergen, Comm Silver Birch, t9: <0.1 kU/L
Allergen, Cottonwood, t14: <0.1 kU/L
Allergen, D pternoyssinus,d7: <0.1 kU/L
Allergen, Mouse Urine Protein, e78: <0.1 kU/L
Allergen, P. notatum, m1: 0.51 kU/L — ABNORMAL HIGH
Bermuda Grass: <0.1 kU/L
CLADOSPORIUM HERBARUM (M2) IGE: <0.1 kU/L
CLASS: 0
CLASS: 0
CLASS: 1
COMMON RAGWEED (SHORT) (W1) IGE: <0.1 kU/L
Cat Dander: <0.1 kU/L
Class: 0
Class: 0
Class: 0
Class: 0
Class: 0
Class: 0
Class: 0
Class: 0
Class: 0
Class: 0
Class: 0
Class: 0
Class: 0
Class: 0
Class: 0
Class: 0
Class: 0
Class: 0
Class: 0
Class: 0
Class: 0
D. farinae: <0.1 kU/L
Dog Dander: 0.33 kU/L — ABNORMAL HIGH
IGE (IMMUNOGLOBULIN E), SERUM: 108 kU/L (ref <=114)
Johnson Grass: <0.1 kU/L
Pecan/Hickory Tree IgE: <0.1 kU/L
Rough Pigweed  IgE: <0.1 kU/L
Sheep Sorrel IgE: <0.1 kU/L

## 2018-06-16 LAB — INTERPRETATION:

## 2018-06-17 ENCOUNTER — Telehealth: Payer: Self-pay | Admitting: Adult Health

## 2018-06-17 NOTE — Telephone Encounter (Signed)
Discussed results  Feeling much better

## 2018-06-17 NOTE — Telephone Encounter (Signed)
Notes recorded by Julio SicksParrett, Tammy S, NP on 06/16/2018 at 4:57 PM EDT Allergy test is slightly positive to mold and dog dander , IgE is elevated  Cont w/ ov recs Please contact office for sooner follow up if symptoms do not improve or worsen or seek emergency care.  Called and spoke to pt, who had additional questions regarding allergies. Pt states he currently has a dog, and is questioning if it is necessary to get rid of his dog.   TP please advise. Thanks

## 2018-07-17 ENCOUNTER — Ambulatory Visit: Payer: No Typology Code available for payment source | Admitting: Pulmonary Disease

## 2018-07-17 ENCOUNTER — Encounter: Payer: Self-pay | Admitting: Pulmonary Disease

## 2018-07-17 VITALS — BP 138/82 | HR 56 | Ht 70.0 in | Wt 223.0 lb

## 2018-07-17 DIAGNOSIS — J4531 Mild persistent asthma with (acute) exacerbation: Secondary | ICD-10-CM

## 2018-07-17 DIAGNOSIS — G4733 Obstructive sleep apnea (adult) (pediatric): Secondary | ICD-10-CM | POA: Diagnosis not present

## 2018-07-17 DIAGNOSIS — R0609 Other forms of dyspnea: Secondary | ICD-10-CM

## 2018-07-17 DIAGNOSIS — K219 Gastro-esophageal reflux disease without esophagitis: Secondary | ICD-10-CM

## 2018-07-17 DIAGNOSIS — Z0000 Encounter for general adult medical examination without abnormal findings: Secondary | ICD-10-CM

## 2018-07-17 NOTE — Assessment & Plan Note (Signed)
Continue omeprazole 40 mg daily  Discussed with patient that he needs to be adherent to GERD diet  Decrease amount of sodas, fattening foods, ice cream, spicy foods Limit consumption 2 hours prior to going to bed Do not eat and then lay flat

## 2018-07-17 NOTE — Assessment & Plan Note (Signed)
Continue to work towards healthy weight 

## 2018-07-17 NOTE — Assessment & Plan Note (Signed)
  Mask of choice order to DME   We recommend that you continue using your CPAP daily >>>Keep up the hard work using your device >>> Goal should be wearing this for the entire night that you are sleeping, at least 4 to 6 hours  Remember:  . Do not drive or operate heavy machinery if tired or drowsy.  . Please notify the supply company and office if you are unable to use your device regularly due to missing supplies or machine being broken.  . Work on maintaining a healthy weight and following your recommended nutrition plan  . Maintain proper daily exercise and movement  . Maintaining proper use of your device can also help improve management of other chronic illnesses such as: Blood pressure, blood sugars, and weight management.   BiPAP/ CPAP Cleaning:  >>>Clean weekly, with Dawn soap, and bottle brush.  Set up to air dry.

## 2018-07-17 NOTE — Assessment & Plan Note (Signed)
Continue Symbicort >>> 2 puffs in the morning right when you wake up, rinse out your mouth after use, 12 hours later 2 puffs, rinse after use >>> Take this daily, no matter what >>> This is not a rescue inhaler   Pulmonary Function test prior to next appointment   Follow up in 2 months with FENO

## 2018-07-17 NOTE — Progress Notes (Signed)
@Patient  ID: Melvin Crawford, male    DOB: [birth year], 57 y.o.   MRN: [id number]  Chief Complaint  Patient presents with  . 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 Isaiah Serge   Recent [facility] [place] [facility] Encounters:   06/13/2018-acute visit-TP Patient presents for acute office visit after being exposed to chlorine gas at work. Feno today is 228. Plan: Start Symbicort 160, RAST panel, CBC with differential, chest x-ray, prednisone taper, cont CPAP   07/17/2018  - Visit   57 year old patient presents today for follow-up visit.  Patient reports he is been feeling much better since last office visit.  Patient has completed prednisone.  Patient is adherent to Symbicort 160 inhaler.  Patient is requesting to stop this inhaler today.  Patient reports he has no current symptoms today.  Patient has been compliant to CPAP use.  Patient CPAP compliance report showing 29 out of 30 days use, 28 of those days greater than 4 hours, average usage days 6 hours , APAP pressure 5-15, AHI 0.4.     Allergies  Allergen Reactions  . Atorvastatin Other (See Comments)    hives  . Codeine Nausea And Vomiting    Immunization History  Administered Date(s) Administered  . Influenza Split 10/05/2015, 12/27/2017  . Influenza-Unspecified 09/26/2016  . Zoster 02/28/2015   Consider pneumonia vaccine after completion of PFTs ordered today Patient will need flu vaccine patient reports he receives this from work  Past Medical History:  Diagnosis Date  . GERD (gastroesophageal reflux disease)   . Hypertension     Tobacco History: Social History   Tobacco Use  Smoking Status Never Smoker  Smokeless Tobacco Never Used   Counseling given: Yes Continue not  smoking  Outpatient Encounter Medications as of 07/17/2018  Medication Sig  . albuterol (PROVENTIL HFA;VENTOLIN HFA) 108 (90 Base) MCG/ACT inhaler Inhale 2 puffs into the lungs every 4 (four) hours as needed for wheezing or shortness of breath.  Marland Kitchen albuterol (PROVENTIL) (2.5 MG/3ML) 0.083% nebulizer solution Take 2.5 mg by nebulization every 4 (four) hours as needed for wheezing or shortness of breath.  Marland Kitchen aspirin 81 MG tablet Take 162 mg by mouth daily.   . budesonide-formoterol (SYMBICORT) 160-4.5 MCG/ACT inhaler Inhale 2 puffs into the lungs 2 (two) times daily.  . budesonide-formoterol (SYMBICORT) 160-4.5 MCG/ACT inhaler Inhale 2 puffs into the lungs 2 (two) times daily.  Marland Kitchen losartan (COZAAR) 100 MG tablet Take 100 mg by mouth daily.  . nabumetone (RELAFEN) 750 MG tablet Take 750 mg by mouth 2 (two) times daily as needed for moderate pain.  . Omega-3 Fatty Acids (FISH OIL) 1200 MG CAPS Take 1 capsule by mouth 2 (two) times daily.  Marland Kitchen omeprazole (PRILOSEC) 40 MG capsule Take 40 mg by mouth daily.  . predniSONE (DELTASONE) 10 MG tablet 4 tabs for 2 days, then 3 tabs for 2 days, 2 tabs for 2 days, then 1 tab for 2 days, then stop (Patient not taking: Reported on 07/17/2018)  . [DISCONTINUED] albuterol (PROVENTIL HFA;VENTOLIN HFA) 108 (90 Base) MCG/ACT inhaler Inhale 2 puffs into the lungs every 4 (four) hours as needed for wheezing or shortness of breath. (Patient not taking: Reported on 07/17/2018)  . [DISCONTINUED] Cholecalciferol (VITAMIN D) 2000 UNITS CAPS Take 1 capsule  by mouth daily.  . [DISCONTINUED] Cyanocobalamin (B-12) 1000 MCG CAPS Take 1,200 mg by mouth.   No facility-administered encounter medications on file as of 07/17/2018.      Review of Systems  Constitutional:  +fatigue   No  weight loss, night sweats,  fevers, chills HEENT:   No headaches,  Difficulty swallowing,  Tooth/dental problems, or  Sore throat, No sneezing, itching, ear ache, nasal congestion, post nasal drip  CV: No  chest pain, orthopnea, PND, swelling in lower extremities, anasarca, dizziness, palpitations, syncope  GI: No heartburn, indigestion, abdominal pain, nausea, vomiting, diarrhea, change in bowel habits, loss of appetite, bloody stools Resp: No shortness of breath with exertion or at rest.  No excess mucus, no productive cough,  No non-productive cough,  No coughing up of blood.  No change in color of mucus.  No wheezing.  No chest wall deformity Skin: no rash, lesions, no skin changes. GU: no dysuria, change in color of urine, no urgency or frequency.  No flank pain, no hematuria  MS:  No joint pain or swelling.  No decreased range of motion.  No back pain. Psych:  No change in mood or affect. No depression or anxiety.  No memory loss.   Physical Exam  BP 138/82   Pulse (!) 56   Ht 5\' 10"  (1.778 m)   Wt 223 lb (101.2 kg)   SpO2 97%   BMI 32.00 kg/m   Wt Readings from Last 5 Encounters:  07/17/18 223 lb (101.2 kg)  06/13/18 221 lb 9.6 oz (100.5 kg)  07/03/17 217 lb 12.8 oz (98.8 kg)  06/28/16 216 lb (98 kg)  03/27/16 211 lb (95.7 kg)     Physical Exam  Constitutional: He is oriented to person, place, and time and well-developed, well-nourished, and in no distress. No distress.  HENT:  Head: Normocephalic and atraumatic.  Right Ear: Hearing, tympanic membrane, external ear and ear canal normal.  Left Ear: Hearing, tympanic membrane, external ear and ear canal normal.  Nose: Nose normal.  Mouth/Throat: Uvula is midline and oropharynx is clear and moist. No oropharyngeal exudate.  Eyes: Pupils are equal, round, and reactive to light.  Neck: Normal range of motion. Neck supple. No JVD present.  Cardiovascular: Normal rate, regular rhythm and normal heart sounds.  Pulmonary/Chest: Effort normal and breath sounds normal. No accessory muscle usage. No respiratory distress. He has no decreased breath sounds. He has no wheezes. He has no rhonchi.  Abdominal: Soft. Bowel sounds are  normal. There is no tenderness.  Musculoskeletal: Normal range of motion. He exhibits no edema.  Lymphadenopathy:    He has no cervical adenopathy.  Neurological: He is alert and oriented to person, place, and time. Gait normal.  Skin: Skin is warm and dry. He is not diaphoretic. No erythema.  Psychiatric: Mood, memory, affect and judgment normal.  Nursing note and vitals reviewed.   Lab Results:  CBC    Component Value Date/Time   WBC 6.6 06/13/2018 1039   RBC 5.22 06/13/2018 1039   HGB 16.4 06/13/2018 1039   HCT 47.8 06/13/2018 1039   PLT 204.0 06/13/2018 1039   MCV 91.7 06/13/2018 1039   MCH 31.8 11/21/2013 1258   MCHC 34.2 06/13/2018 1039   RDW 14.1 06/13/2018 1039   LYMPHSABS 1.1 06/13/2018 1039   MONOABS 0.7 06/13/2018 1039   EOSABS 0.8 (H) 06/13/2018 1039   BASOSABS 0.1 06/13/2018 1039    BMET    Component Value Date/Time   NA 137  12/03/2013 1304   K 4.4 12/03/2013 1304   CL 102 12/03/2013 1304   CO2 25 11/21/2013 1640   GLUCOSE 107 (H) 12/03/2013 1304   BUN 16 12/03/2013 1304   CREATININE 1.30 12/03/2013 1304   CALCIUM 9.2 11/21/2013 1640   GFRNONAA 74 (L) 11/21/2013 1640   GFRAA 85 (L) 11/21/2013 1640    BNP No results found for: BNP  ProBNP No results found for: PROBNP  Imaging: No results found.     Tests:   06/13/18 -FeNO-228  PFTs 11/23/14 FVC 3.19 (69%) FEV1 2.76 [74%] F/F 81 TLC 78 DLCO 84% Mild restrictive lung disease. No obstruction or DLCO impairment. No bronchodilator response.   Sleep study 11/11/3   -  Mild OSA, AHI 12   06/13/2018-CBC with differential-eosinophils relative 12.5, eosinophils absolute 0.8 06/13/2018- respiratory allergy panel-multiple allergens, IgE 108  06/13/18-chest x-ray-no acute cardiopulmonary disease, lungs are clear  Chart Review:      Assessment & Plan:   Pleasant 57 year old patient seen office visit today.  We will bring patient back in 2 months.  We will also have patient complete  pulmonary function testing.  Last pulmonary function testing was done in 2015.  We will have patient continue Symbicort 160 inhaler at this time.  Consider repeat FeNO office visit.  If lung functioning is decreased on pulmonary function test consider Pneumovax, patient to receive flu vaccine from work.  Asthma Continue Symbicort >>> 2 puffs in the morning right when you wake up, rinse out your mouth after use, 12 hours later 2 puffs, rinse after use >>> Take this daily, no matter what >>> This is not a rescue inhaler   Pulmonary Function test prior to next appointment   Follow up in 2 months with FENO   OSA (obstructive sleep apnea)  Mask of choice order to DME   We recommend that you continue using your CPAP daily >>>Keep up the hard work using your device >>> Goal should be wearing this for the entire night that you are sleeping, at least 4 to 6 hours  Remember:  . Do not drive or operate heavy machinery if tired or drowsy.  . Please notify the supply company and office if you are unable to use your device regularly due to missing supplies or machine being broken.  . Work on maintaining a healthy weight and following your recommended nutrition plan  . Maintain proper daily exercise and movement  . Maintaining proper use of your device can also help improve management of other chronic illnesses such as: Blood pressure, blood sugars, and weight management.   BiPAP/ CPAP Cleaning:  >>>Clean weekly, with Dawn soap, and bottle brush.  Set up to air dry.   Obesity Continue to work towards healthy weight  GERD (gastroesophageal reflux disease) Continue omeprazole 40 mg daily  Discussed with patient that he needs to be adherent to GERD diet  Decrease amount of sodas, fattening foods, ice cream, spicy foods Limit consumption 2 hours prior to going to bed Do not eat and then lay flat   Healthcare maintenance Consider Pneumovax 23 if PFTs show chronic lung disease  Patient to  receive flu vaccine from work     Coral Ceo, NP 07/17/2018

## 2018-07-17 NOTE — Assessment & Plan Note (Signed)
Consider Pneumovax 23 if PFTs show chronic lung disease  Patient to receive flu vaccine from work

## 2018-07-17 NOTE — Patient Instructions (Addendum)
Pulmonary Function test prior to next appointment   Mask of choice order to DME   Continue Symbicort 160 >>> 2 puffs in the morning right when you wake up, rinse out your mouth after use, 12 hours later 2 puffs, rinse after use >>> Take this daily, no matter what >>> This is not a rescue inhaler   We recommend that you continue using your CPAP daily >>>Keep up the hard work using your device >>> Goal should be wearing this for the entire night that you are sleeping, at least 4 to 6 hours  Remember:  . Do not drive or operate heavy machinery if tired or drowsy.  . Please notify the supply company and office if you are unable to use your device regularly due to missing supplies or machine being broken.  . Work on maintaining a healthy weight and following your recommended nutrition plan  . Maintain proper daily exercise and movement  . Maintaining proper use of your device can also help improve management of other chronic illnesses such as: Blood pressure, blood sugars, and weight management.   BiPAP/ CPAP Cleaning:  >>>Clean weekly, with Dawn soap, and bottle brush.  Set up to air dry.    Follow up in 2 months with FENO   Please contact the office if your symptoms worsen or you have concerns that you are not improving.   Thank you for choosing [facility] for your healthcare, and for allowing us to partner with you on your healthcare journey. I am thankful to be able to provide care to you today.   Elisha HeadlandBrian Mack FNP-C

## 2018-09-16 ENCOUNTER — Ambulatory Visit: Payer: No Typology Code available for payment source | Admitting: Pulmonary Disease

## 2018-09-18 NOTE — Progress Notes (Signed)
@Patient  ID: Melvin Crawford, male    DOB: 11/16/1961, 57 y.o.   MRN: [id number]  Chief Complaint  Patient presents with  . Follow-up    PFT today    Referring provider: Gordan Payment., MD  HPI:  57 year old male never smoker followed in our office for obstructive sleep apnea and suspected RAD  Smoker/ Smoking History: Never smoker maintenance: Symbicort 160 Pt of: Dr. Isaiah Serge   09/19/2018  - Visit   57 year old male patient presents today for follow-up.  09/19/2018-pulmonary function test- FVC 3.43 (73% predicted), ratio 83, FEV1 80, no significant bronchodilator response, mid flow reversibility after BD,  DLCO 81  Patient reports she is been doing quite well since last office visit and remains adherent to his Symbicort 160.  Patient reports his breathing has been doing well.  With no [place] use.  Patient reports he is not patient does admit to nasal congestion which he attributes to seasonal allergies.  CPAP compliance report showing 30 out of 3030 of those days greater than 4 hours.  Usage 6 hours and 57 minutes.  APAP settings 5-15.  AHI 0.4.  Patient reports no issues using CPAP.  He reports that he sleeps with it every night and cannot sleep without it.     Tests:  06/13/18 -FeNO-228  PFTs 11/23/14 FVC 3.19 (69%) FEV1 2.76 [74%] F/F 81 TLC 78 DLCO 84% Mild restrictive lung disease. No obstruction or DLCO impairment. No bronchodilator response.   Sleep study 11/11/3   -  Mild OSA, AHI 12   06/13/2018-CBC with differential-eosinophils relative 12.5, eosinophils absolute 0.8 06/13/2018- respiratory allergy panel-multiple allergens, IgE 108  06/13/18-chest x-ray-no acute cardiopulmonary disease, lungs are clear  FENO:  Lab Results  Component Value Date   NITRICOXIDE 228 06/13/2018    PFT: PFT Results Latest Ref Rng & Units 09/19/2018  FVC-Pre L 3.43  FVC-Predicted Pre % 73  FVC-Post L 3.41  FVC-Predicted Post % 72  Pre FEV1/FVC % % 83  Post FEV1/FCV % % 89   FEV1-Pre L 2.85  FEV1-Predicted Pre % 80  DLCO UNC% % 81  DLCO COR %Predicted % 108  TLC L 5.21  TLC % Predicted % 76  RV % Predicted % 69    Imaging: No results found.  Chart Review:    Specialty Problems      Pulmonary Problems   Exertional dyspnea   OSA (obstructive sleep apnea)     Sleep study 11/11/3   -  Mild OSA, AHI 12        Asthma     06/13/18 -FeNO-228  PFTs 11/23/14 FVC 3.19 (69%) FEV1 2.76 [74%] F/F 81 TLC 78 DLCO 84% Mild restrictive lung disease. No obstruction or DLCO impairment. No bronchodilator response.  06/13/2018-CBC with differential-eosinophils relative 12.5, eosinophils absolute 0.8 06/13/2018- respiratory allergy panel-multiple allergens, IgE 108  09/19/2018-pulmonary function test- FVC 3.43 (73% predicted), ratio 83, FEV1 80, no significant bronchodilator response, mid flow reversibility after BD,  DLCO 81  09/19/18 - FENO - 25       Allergic rhinitis      Allergies  Allergen Reactions  . Atorvastatin Other (See Comments)    hives  . Codeine Nausea And Vomiting    Immunization History  Administered Date(s) Administered  . Influenza Split 10/05/2015, 12/27/2017  . Influenza Whole 08/26/2018  . Influenza-Unspecified 09/26/2016  . Zoster 02/28/2015   Flu vaccine today    Past Medical History:  Diagnosis Date  . GERD (gastroesophageal reflux disease)   .  Hypertension     Tobacco History: Social History   Tobacco Use  Smoking Status Never Smoker  Smokeless Tobacco Never Used  Tobacco Comment   social smoker 40 years ago   Counseling given: Yes Comment: social smoker 40 years ago  Continue to not smoke  Outpatient Encounter Medications as of 09/19/2018  Medication Sig  . albuterol (PROVENTIL HFA;VENTOLIN HFA) 108 (90 Base) MCG/ACT inhaler Inhale 2 puffs into the lungs every 4 (four) hours as needed for wheezing or shortness of breath.  Marland Kitchen albuterol (PROVENTIL) (2.5 MG/3ML) 0.083% nebulizer solution Take 2.5  mg by nebulization every 4 (four) hours as needed for wheezing or shortness of breath.  Marland Kitchen aspirin 81 MG tablet Take 162 mg by mouth daily.   . budesonide-formoterol (SYMBICORT) 160-4.5 MCG/ACT inhaler Inhale 2 puffs into the lungs 2 (two) times daily.  Marland Kitchen losartan (COZAAR) 100 MG tablet Take 100 mg by mouth daily.  . nabumetone (RELAFEN) 750 MG tablet Take 750 mg by mouth 2 (two) times daily as needed for moderate pain.  . Omega-3 Fatty Acids (FISH OIL) 1200 MG CAPS Take 1 capsule by mouth 2 (two) times daily.  Marland Kitchen omeprazole (PRILOSEC) 40 MG capsule Take 40 mg by mouth daily.  . budesonide-formoterol (SYMBICORT) 160-4.5 MCG/ACT inhaler Inhale 2 puffs into the lungs 2 (two) times daily.  . [DISCONTINUED] budesonide-formoterol (SYMBICORT) 160-4.5 MCG/ACT inhaler Inhale 2 puffs into the lungs 2 (two) times daily.  . [DISCONTINUED] predniSONE (DELTASONE) 10 MG tablet 4 tabs for 2 days, then 3 tabs for 2 days, 2 tabs for 2 days, then 1 tab for 2 days, then stop (Patient not taking: Reported on 07/17/2018)   No facility-administered encounter medications on file as of 09/19/2018.      Review of Systems  Review of Systems  Constitutional: Negative for activity change, chills, fatigue, fever and unexpected weight change.  HENT: Positive for congestion and postnasal drip. Negative for rhinorrhea, sinus pressure, sinus pain, sneezing and sore throat.   Eyes: Negative.   Respiratory: Negative for cough, shortness of breath and wheezing.   Cardiovascular: Negative for chest pain and palpitations.  Gastrointestinal: Negative for constipation, diarrhea, nausea and vomiting.       Adherent to omeprazole, reports occasional heartburn about 1-2 times a month if he eats foods that he knows can exacerbate GERD.  Endocrine: Negative.   Musculoskeletal: Negative.   Skin: Negative.   Neurological: Negative for dizziness and headaches.  Psychiatric/Behavioral: Negative.  Negative for dysphoric mood. The patient  is not nervous/anxious.   All other systems reviewed and are negative.    Physical Exam  BP (!) 144/76 (BP Location: Left Arm, Cuff Size: Normal)   Pulse 65   Ht 5' 9.5" (1.765 m)   Wt 220 lb (99.8 kg)   SpO2 95%   BMI 32.02 kg/m   Wt Readings from Last 5 Encounters:  09/19/18 220 lb (99.8 kg)  07/17/18 223 lb (101.2 kg)  06/13/18 221 lb 9.6 oz (100.5 kg)  07/03/17 217 lb 12.8 oz (98.8 kg)  06/28/16 216 lb (98 kg)     Physical Exam  Constitutional: He is oriented to person, place, and time and well-developed, well-nourished, and in no distress. No distress.  HENT:  Head: Normocephalic and atraumatic.  Right Ear: Hearing, tympanic membrane, external ear and ear canal normal.  Left Ear: Hearing, tympanic membrane, external ear and ear canal normal.  Nose: Mucosal edema and rhinorrhea present. Right sinus exhibits no maxillary sinus tenderness and no frontal sinus  tenderness. Left sinus exhibits no maxillary sinus tenderness and no frontal sinus tenderness.  Mouth/Throat: Uvula is midline and oropharynx is clear and moist. No oropharyngeal exudate.  +PND  Eyes: Pupils are equal, round, and reactive to light.  Neck: Normal range of motion. Neck supple. No JVD present.  Cardiovascular: Normal rate, regular rhythm and normal heart sounds.  Pulmonary/Chest: Effort normal and breath sounds normal. No accessory muscle usage. No respiratory distress. He has no decreased breath sounds. He has no wheezes. He has no rhonchi. He has no rales.  Abdominal: Soft. Bowel sounds are normal. There is no tenderness.  Musculoskeletal: Normal range of motion. He exhibits no edema.  Lymphadenopathy:    He has no cervical adenopathy.  Neurological: He is alert and oriented to person, place, and time. Gait normal.  Skin: Skin is warm and dry. He is not diaphoretic. No erythema.  Psychiatric: Mood, memory, affect and judgment normal.  Nursing note and vitals reviewed.   09/19/18 - FENO - 25  Lab  Results:  CBC    Component Value Date/Time   WBC 6.6 06/13/2018 1039   RBC 5.22 06/13/2018 1039   HGB 16.4 06/13/2018 1039   HCT 47.8 06/13/2018 1039   PLT 204.0 06/13/2018 1039   MCV 91.7 06/13/2018 1039   MCH 31.8 11/21/2013 1258   MCHC 34.2 06/13/2018 1039   RDW 14.1 06/13/2018 1039   LYMPHSABS 1.1 06/13/2018 1039   MONOABS 0.7 06/13/2018 1039   EOSABS 0.8 (H) 06/13/2018 1039   BASOSABS 0.1 06/13/2018 1039    BMET    Component Value Date/Time   NA 137 12/03/2013 1304   K 4.4 12/03/2013 1304   CL 102 12/03/2013 1304   CO2 25 11/21/2013 1640   GLUCOSE 107 (H) 12/03/2013 1304   BUN 16 12/03/2013 1304   CREATININE 1.30 12/03/2013 1304   CALCIUM 9.2 11/21/2013 1640   GFRNONAA 74 (L) 11/21/2013 1640   GFRAA 85 (L) 11/21/2013 1640    BNP No results found for: BNP  ProBNP No results found for: PROBNP    Assessment & Plan:   57 year old male patient presents today for follow-up visit.  Reviewed pulmonary function test as well as we did FeNO. FeNO is much more controlled today at 25.  This is a significant improvement from 228 where he was 3 months ago.  We will have patient start daily antihistamine to help better manage his continue with CPAP.  Follow-up in 3 months.  GERD (gastroesophageal reflux disease) Continue omeprazole Continue to avoid foods that exacerbate GERD Contact our office if you are having to use Tums  Or if you feel that your heartburn is not controlled  OSA (obstructive sleep apnea) We recommend that you continue using your CPAP daily >>>Keep up the hard work using your device >>> Goal should be wearing this for the entire night that you are sleeping, at least 4 to 6 hours  Remember:  . Do not drive or operate heavy machinery if tired or drowsy.  . Please notify the supply company and office if you are unable to use your device regularly due to missing supplies or machine being broken.  . Work on maintaining a healthy weight and following  your recommended nutrition plan  . Maintain proper daily exercise and movement  . Maintaining proper use of your device can also help improve management of other chronic illnesses such as: Blood pressure, blood sugars, and weight management.   BiPAP/ CPAP Cleaning:  >>>Clean weekly, with Dawn soap,  and bottle brush.  Set up to air dry.    Healthcare maintenance Patient is Artie received flu vaccine Patient follow-up with primary care to ensure he is had the pneumonia vaccine if not he will receive  Asthma FENO today  >>>25  Continue Symbicort 160 >>> 2 puffs in the morning right when you wake up, rinse out your mouth after use, 12 hours later 2 puffs, rinse after use >>> Take this daily, no matter what >>> This is not a rescue inhaler  >>>samples provided today  >>>card provided today to help with copay cost   Can consider starting daily antihistamine such as Zyrtec >>> choose generic  >>>take daily >>>could trial during seasonal change   Can also try flonase for nasal congestion    Follow-up in 3 months with Dr. Isaiah Serge  Allergic rhinitis  Can consider starting daily antihistamine such as Zyrtec >>> choose generic  >>>take daily >>>could trial during seasonal change   Can also try flonase for nasal congestion    Follow-up in 3 months with Dr. Magdalene Molly, NP 09/19/2018

## 2018-09-19 ENCOUNTER — Ambulatory Visit (INDEPENDENT_AMBULATORY_CARE_PROVIDER_SITE_OTHER): Payer: No Typology Code available for payment source | Admitting: Pulmonary Disease

## 2018-09-19 ENCOUNTER — Encounter: Payer: Self-pay | Admitting: Pulmonary Disease

## 2018-09-19 ENCOUNTER — Ambulatory Visit: Payer: No Typology Code available for payment source | Admitting: Pulmonary Disease

## 2018-09-19 VITALS — BP 144/76 | HR 65 | Ht 69.5 in | Wt 220.0 lb

## 2018-09-19 DIAGNOSIS — R0609 Other forms of dyspnea: Secondary | ICD-10-CM

## 2018-09-19 DIAGNOSIS — J45909 Unspecified asthma, uncomplicated: Secondary | ICD-10-CM

## 2018-09-19 DIAGNOSIS — Z0000 Encounter for general adult medical examination without abnormal findings: Secondary | ICD-10-CM | POA: Diagnosis not present

## 2018-09-19 DIAGNOSIS — K219 Gastro-esophageal reflux disease without esophagitis: Secondary | ICD-10-CM | POA: Diagnosis not present

## 2018-09-19 DIAGNOSIS — J4531 Mild persistent asthma with (acute) exacerbation: Secondary | ICD-10-CM | POA: Diagnosis not present

## 2018-09-19 DIAGNOSIS — G4733 Obstructive sleep apnea (adult) (pediatric): Secondary | ICD-10-CM

## 2018-09-19 DIAGNOSIS — J309 Allergic rhinitis, unspecified: Secondary | ICD-10-CM

## 2018-09-19 HISTORY — DX: Allergic rhinitis, unspecified: J30.9

## 2018-09-19 LAB — PULMONARY FUNCTION TEST
DL/VA % pred: 108 %
DL/VA: 4.96 ml/min/mmHg/L
DLCO UNC % PRED: 81 %
DLCO UNC: 25.55 ml/min/mmHg
FEF 25-75 Post: 4.14 L/sec
FEF 25-75 Pre: 3.14 L/sec
FEF2575-%Change-Post: 31 %
FEF2575-%Pred-Post: 140 %
FEF2575-%Pred-Pre: 106 %
FEV1-%Change-Post: 6 %
FEV1-%Pred-Post: 85 %
FEV1-%Pred-Pre: 80 %
FEV1-POST: 3.04 L
FEV1-Pre: 2.85 L
FEV1FVC-%Change-Post: 7 %
FEV1FVC-%Pred-Pre: 109 %
FEV6-%CHANGE-POST: 0 %
FEV6-%PRED-PRE: 76 %
FEV6-%Pred-Post: 76 %
FEV6-POST: 3.41 L
FEV6-PRE: 3.42 L
FEV6FVC-%Change-Post: 0 %
FEV6FVC-%PRED-PRE: 104 %
FEV6FVC-%Pred-Post: 104 %
FVC-%CHANGE-POST: 0 %
FVC-%Pred-Post: 72 %
FVC-%Pred-Pre: 73 %
FVC-Post: 3.41 L
FVC-Pre: 3.43 L
POST FEV6/FVC RATIO: 100 %
Post FEV1/FVC ratio: 89 %
Pre FEV1/FVC ratio: 83 %
Pre FEV6/FVC Ratio: 100 %
RV % PRED: 69 %
RV: 1.51 L
TLC % pred: 76 %
TLC: 5.21 L

## 2018-09-19 MED ORDER — BUDESONIDE-FORMOTEROL FUMARATE 160-4.5 MCG/ACT IN AERO: 2.0000 | INHALATION_SPRAY | Freq: Two times a day (BID) | RESPIRATORY_TRACT | 0 refills | Status: DC

## 2018-09-19 NOTE — Patient Instructions (Addendum)
FENO today  >>>25  Continue Symbicort 160 >>> 2 puffs in the morning right when you wake up, rinse out your mouth after use, 12 hours later 2 puffs, rinse after use >>> Take this daily, no matter what >>> This is not a rescue inhaler  >>>samples provided today  >>>card provided today to help with copay cost   Can consider starting daily antihistamine such as Zyrtec >>> choose generic  >>>take daily >>>could trial during seasonal change   Can also try flonase for nasal congestion    Follow-up in 3 months with Dr. Isaiah Serge   We recommend that you continue using your CPAP daily >>>Keep up the hard work using your device >>> Goal should be wearing this for the entire night that you are sleeping, at least 4 to 6 hours  Remember:  . Do not drive or operate heavy machinery if tired or drowsy.  . Please notify the supply company and office if you are unable to use your device regularly due to missing supplies or machine being broken.  . Work on maintaining a healthy weight and following your recommended nutrition plan  . Maintain proper daily exercise and movement  . Maintaining proper use of your device can also help improve management of other chronic illnesses such as: Blood pressure, blood sugars, and weight management.   BiPAP/ CPAP Cleaning:  >>>Clean weekly, with Dawn soap, and bottle brush.  Set up to air dry.        November/2019 we will be moving! We will no longer be at our [place] location.  Be on the look out for a post card/mailer to let you know we have officially moved.  Our new address and phone number will be:  [address] [place], [place] [address] Telephone number: [phone]  It is flu season:   >>>Remember to be washing your hands regularly, using hand sanitizer, be careful to use around herself with has contact with people who are sick will increase her chances of getting sick yourself. >>> Best ways to protect herself from the flu: Receive  the yearly flu vaccine, practice good hand hygiene washing with soap and also using hand sanitizer when available, eat a nutritious meals, get adequate rest, hydrate appropriately   Please contact the office if your symptoms worsen or you have concerns that you are not improving.   Thank you for choosing [facility] for your healthcare, and for allowing [place] to partner with you on your healthcare journey. I am thankful to be able to provide care to you today.   Elisha Headland FNP-C     Pneumococcal Vaccine, Polyvalent solution for injection What is this medicine? PNEUMOCOCCAL VACCINE, POLYVALENT (NEU mo KOK al vak SEEN, pol ee VEY luhnt) is a vaccine to prevent pneumococcus bacteria infection. These bacteria are a major cause of ear infections, Strep throat infections, and serious pneumonia, meningitis, or blood infections worldwide. These vaccines help the body to produce antibodies (protective substances) that help your body defend against these bacteria. This vaccine is recommended for people 27 years of age and older with health problems. It is also recommended for all adults over 10 years old. This vaccine will not treat an infection. This medicine may be used for other purposes; ask your health care provider or pharmacist if you have questions. COMMON BRAND NAME(S): Pneumovax 23 What should I tell my health care provider before I take this medicine? They need to know if you have any of these conditions: -  bleeding problems -bone marrow or organ transplant -cancer, Hodgkin's disease -fever -infection -immune system problems -low platelet count in the blood -seizures -an unusual or allergic reaction to pneumococcal vaccine, diphtheria toxoid, other vaccines, latex, other medicines, foods, dyes, or preservatives -pregnant or trying to get pregnant -breast-feeding How should I use this medicine? This vaccine is for injection into a muscle or under the skin. It is given by a health  care professional. A copy of Vaccine Information Statements will be given before each vaccination. Read this sheet carefully each time. The sheet may change frequently. Talk to your pediatrician regarding the use of this medicine in children. While this drug may be prescribed for children as young as 68 years of age for selected conditions, precautions do apply. Overdosage: If you think you have taken too much of this medicine contact a poison control center or emergency room at once. NOTE: This medicine is only for you. Do not share this medicine with others. What if I miss a dose? It is important not to miss your dose. Call your doctor or health care professional if you are unable to keep an appointment. What may interact with this medicine? -medicines for cancer chemotherapy -medicines that suppress your immune function -medicines that treat or prevent blood clots like warfarin, enoxaparin, and dalteparin -steroid medicines like prednisone or cortisone This list may not describe all possible interactions. Give your health care provider a list of all the medicines, herbs, non-prescription drugs, or dietary supplements you use. Also tell them if you smoke, drink alcohol, or use illegal drugs. Some items may interact with your medicine. What should I watch for while using this medicine? Mild fever and pain should go away in 3 days or less. Report any unusual symptoms to your doctor or health care professional. What side effects may I notice from receiving this medicine? Side effects that you should report to your doctor or health care professional as soon as possible: -allergic reactions like skin rash, itching or hives, swelling of the face, lips, or tongue -breathing problems -confused -fever over 102 degrees F -pain, tingling, numbness in the hands or feet -seizures -unusual bleeding or bruising -unusual muscle weakness Side effects that usually do not require medical attention (report to  your doctor or health care professional if they continue or are bothersome): -aches and pains -diarrhea -fever of 102 degrees F or less -headache -irritable -loss of appetite -pain, tender at site where injected -trouble sleeping This list may not describe all possible side effects. Call your doctor for medical advice about side effects. You may report side effects to FDA at 1-800-FDA-1088. Where should I keep my medicine? This does not apply. This vaccine is given in a clinic, pharmacy, doctor's office, or other health care setting and will not be stored at home. NOTE: This sheet is a summary. It may not cover all possible information. If you have questions about this medicine, talk to your doctor, pharmacist, or health care provider.  2018 Elsevier/Gold Standard (2008-06-18 14:32:37)

## 2018-09-19 NOTE — Progress Notes (Signed)
Discussed results with patient in office.  Nothing further is needed at this time.  Brian Mack FNP  

## 2018-09-19 NOTE — Assessment & Plan Note (Signed)
Patient is Melvin Crawford received flu vaccine Patient follow-up with primary care to ensure he is had the pneumonia vaccine if not he will receive

## 2018-09-19 NOTE — Progress Notes (Signed)
Discussed results with patient in office.  Nothing further is needed at this time.  Ceejay Kegley FNP  

## 2018-09-19 NOTE — Assessment & Plan Note (Signed)

## 2018-09-19 NOTE — Progress Notes (Signed)
PFT completed today. 09/19/18  

## 2018-09-19 NOTE — Assessment & Plan Note (Signed)
FENO today  >>>25  Continue Symbicort 160 >>> 2 puffs in the morning right when you wake up, rinse out your mouth after use, 12 hours later 2 puffs, rinse after use >>> Take this daily, no matter what >>> This is not a rescue inhaler  >>>samples provided today  >>>card provided today to help with copay cost   Can consider starting daily antihistamine such as Zyrtec >>> choose generic  >>>take daily >>>could trial during seasonal change   Can also try flonase for nasal congestion    Follow-up in 3 months with Dr. Isaiah Serge

## 2018-09-19 NOTE — Assessment & Plan Note (Signed)
Continue omeprazole Continue to avoid foods that exacerbate GERD Contact our office if you are having to use Tums  Or if you feel that your heartburn is not controlled

## 2018-09-19 NOTE — Assessment & Plan Note (Signed)
  Can consider starting daily antihistamine such as Zyrtec >>> choose generic  >>>take daily >>>could trial during seasonal change   Can also try flonase for nasal congestion    Follow-up in 3 months with Dr. Isaiah Serge

## 2018-12-23 ENCOUNTER — Telehealth: Payer: Self-pay

## 2018-12-23 NOTE — Telephone Encounter (Signed)
Patient returned call.  He rescheduled OV and we scheduled him for PFT on 01/09/2019.  No call back is necessary.

## 2018-12-23 NOTE — Telephone Encounter (Signed)
I called pt to see if he could come in at 12pm for a PFT. Arlys John had wanted him to get the test done prior to next appt. He has an appt with Dr. Isaiah Serge tomorrow at 130 pm. I spoke to his wife, she stated she would get back to me and let me know. There is an opening at 12pm in the main PFT room with Tammy W.

## 2018-12-24 ENCOUNTER — Ambulatory Visit: Payer: No Typology Code available for payment source | Admitting: Pulmonary Disease

## 2019-01-08 ENCOUNTER — Other Ambulatory Visit: Payer: Self-pay | Admitting: Pulmonary Disease

## 2019-01-08 DIAGNOSIS — R0609 Other forms of dyspnea: Principal | ICD-10-CM

## 2019-01-09 ENCOUNTER — Encounter: Payer: Self-pay | Admitting: Pulmonary Disease

## 2019-01-09 ENCOUNTER — Ambulatory Visit: Payer: No Typology Code available for payment source | Admitting: Pulmonary Disease

## 2019-01-09 ENCOUNTER — Ambulatory Visit (INDEPENDENT_AMBULATORY_CARE_PROVIDER_SITE_OTHER): Payer: No Typology Code available for payment source | Admitting: Pulmonary Disease

## 2019-01-09 VITALS — BP 162/82 | HR 77 | Ht 69.25 in | Wt 219.0 lb

## 2019-01-09 DIAGNOSIS — G4733 Obstructive sleep apnea (adult) (pediatric): Secondary | ICD-10-CM | POA: Diagnosis not present

## 2019-01-09 DIAGNOSIS — J4531 Mild persistent asthma with (acute) exacerbation: Secondary | ICD-10-CM

## 2019-01-09 DIAGNOSIS — R0609 Other forms of dyspnea: Secondary | ICD-10-CM | POA: Diagnosis not present

## 2019-01-09 LAB — PULMONARY FUNCTION TEST
DL/VA % PRED: 128 %
DL/VA: 5.46 ml/min/mmHg/L
DLCO unc % pred: 103 %
DLCO unc: 28.07 ml/min/mmHg
FEF 25-75 POST: 4.36 L/s
FEF 25-75 Pre: 3.04 L/sec
FEF2575-%Change-Post: 43 %
FEF2575-%PRED-POST: 150 %
FEF2575-%PRED-PRE: 104 %
FEV1-%CHANGE-POST: 11 %
FEV1-%Pred-Post: 85 %
FEV1-%Pred-Pre: 76 %
FEV1-Post: 2.99 L
FEV1-Pre: 2.69 L
FEV1FVC-%CHANGE-POST: 6 %
FEV1FVC-%PRED-PRE: 110 %
FEV6-%Change-Post: 5 %
FEV6-%Pred-Post: 76 %
FEV6-%Pred-Pre: 72 %
FEV6-POST: 3.37 L
FEV6-Pre: 3.19 L
FEV6FVC-%Change-Post: 0 %
FEV6FVC-%PRED-POST: 104 %
FEV6FVC-%Pred-Pre: 103 %
FVC-%Change-Post: 4 %
FVC-%PRED-PRE: 69 %
FVC-%Pred-Post: 72 %
FVC-POST: 3.38 L
FVC-PRE: 3.23 L
POST FEV1/FVC RATIO: 89 %
Post FEV6/FVC ratio: 100 %
Pre FEV1/FVC ratio: 83 %
Pre FEV6/FVC Ratio: 99 %
RV % pred: 77 %
RV: 1.7 L
TLC % pred: 77 %
TLC: 5.33 L

## 2019-01-09 MED ORDER — BUDESONIDE-FORMOTEROL FUMARATE 160-4.5 MCG/ACT IN AERO: 2.0000 | INHALATION_SPRAY | Freq: Two times a day (BID) | RESPIRATORY_TRACT | 0 refills | Status: DC

## 2019-01-09 NOTE — Progress Notes (Signed)
PFT done today. 

## 2019-01-09 NOTE — Patient Instructions (Signed)
Glad you are doing well with your breathing Continue on the Symbicort 160 Follow-up in 6 months.

## 2019-01-09 NOTE — Progress Notes (Signed)
Melvin Crawford    [id number]    September 24, 1961  Primary Care Physician:Grisso, Evlyn Courier., MD  Referring Physician: Gordan Payment., MD [address] [place], [place] [address]  Chief complaint: Follow-up for asthma  HPI: 58 year old with OSA, asthma. He was previously evaluated in 2017 with dyspnea.  Not felt to be due to asthma Subsequently seen back in 2019 after he had acute occupational exposure to chlorine gas with dyspnea and reactive airways.  FENO was elevated at 228.  He was given a prednisone taper and started on Symbicort.  Follow-up FENO was improved to 25  States that breathing is doing well.  Continues on Symbicort. He has sleep apnea and is on CPAP, stable on therapy with good compliance  Pets: Has a dog Occupation: Works in [employer] representative for [employer] Exposures: Exposure to sodium hypochlorite and chlorine gas in 2019.  No mold, hot tub, Jacuzzi at home Smoking history: Never smoker Travel history: No significant travel history Relevant family history: No significant family history of lung disease.   Outpatient Encounter Medications as of 01/09/2019  Medication Sig  . albuterol (PROVENTIL HFA;VENTOLIN HFA) 108 (90 Base) MCG/ACT inhaler Inhale 2 puffs into the lungs every 4 (four) hours as needed for wheezing or shortness of breath.  Marland Kitchen albuterol (PROVENTIL) (2.5 MG/3ML) 0.083% nebulizer solution Take 2.5 mg by nebulization every 4 (four) hours as needed for wheezing or shortness of breath.  Marland Kitchen aspirin 81 MG tablet Take 162 mg by mouth daily.   . budesonide-formoterol (SYMBICORT) 160-4.5 MCG/ACT inhaler Inhale 2 puffs into the lungs 2 (two) times daily.  Marland Kitchen losartan (COZAAR) 100 MG tablet Take 100 mg by mouth daily.  . nabumetone (RELAFEN) 750 MG tablet Take 750 mg by mouth 2 (two) times daily as needed for moderate pain.  . Omega-3 Fatty Acids (FISH OIL) 1200 MG CAPS Take 1 capsule by mouth 2 (two) times daily.  Marland Kitchen omeprazole (PRILOSEC) 40 MG capsule  Take 40 mg by mouth daily.  . [DISCONTINUED] budesonide-formoterol (SYMBICORT) 160-4.5 MCG/ACT inhaler Inhale 2 puffs into the lungs 2 (two) times daily.   No facility-administered encounter medications on file as of 01/09/2019.     Allergies as of 01/09/2019 - Review Complete 01/09/2019  Allergen Reaction Noted  . Atorvastatin Other (See Comments) 02/28/2016  . Codeine Nausea And Vomiting 12/03/2013    Past Medical History:  Diagnosis Date  . GERD (gastroesophageal reflux disease)   . Hypertension     Past Surgical History:  Procedure Laterality Date  . COLONOSCOPY  2012  . INCISION AND DRAINAGE Right 12/03/2013   Procedure: INCISION AND DRAINAGE RIGHT LONG FINGER ;  Surgeon: Tami Ribas, MD;  Location: [facility];  Service: Orthopedics;  Laterality: Right;    History reviewed. No pertinent family history.  Social History   Socioeconomic History  . Marital status: Married    Spouse name: Not on file  . Number of children: Not on file  . Years of education: Not on file  . Highest education level: Not on file  Occupational History  . Not on file  Social Needs  . Financial resource strain: Not on file  . Food insecurity:    Worry: Not on file    Inability: Not on file  . Transportation needs:    Medical: Not on file    Non-medical: Not on file  Tobacco Use  . Smoking status: Never Smoker  . Smokeless tobacco:  Never Used  . Tobacco comment: social smoker 40 years ago  Substance and Sexual Activity  . Alcohol use: No  . Drug use: No  . Sexual activity: Not on file  Lifestyle  . Physical activity:    Days per week: Not on file    Minutes per session: Not on file  . Stress: Not on file  Relationships  . Social connections:    Talks on phone: Not on file    Gets together: Not on file    Attends religious service: Not on file    Active member of club or organization: Not on file    Attends meetings of clubs or organizations: Not on file     Relationship status: Not on file  . Intimate partner violence:    Fear of current or ex partner: Not on file    Emotionally abused: Not on file    Physically abused: Not on file    Forced sexual activity: Not on file  Other Topics Concern  . Not on file  Social History Narrative  . Not on file    Review of systems: Review of Systems  Constitutional: Negative for fever and chills.  HENT: Negative.   Eyes: Negative for blurred vision.  Respiratory: as per HPI  Cardiovascular: Negative for chest pain and palpitations.  Gastrointestinal: Negative for vomiting, diarrhea, blood per rectum. Genitourinary: Negative for dysuria, urgency, frequency and hematuria.  Musculoskeletal: Negative for myalgias, back pain and joint pain.  Skin: Negative for itching and rash.  Neurological: Negative for dizziness, tremors, focal weakness, seizures and loss of consciousness.  Endo/Heme/Allergies: Negative for environmental allergies.  Psychiatric/Behavioral: Negative for depression, suicidal ideas and hallucinations.  All other systems reviewed and are negative.  Physical Exam: Blood pressure (!) 162/82, pulse 77, height 5' 9.25" (1.759 m), weight 219 lb (99.3 kg), SpO2 97 %. Gen:      No acute distress HEENT:  EOMI, sclera anicteric Neck:     No masses; no thyromegaly Lungs:    Clear to auscultation bilaterally; normal respiratory effort CV:         Regular rate and rhythm; no murmurs Abd:      + bowel sounds; soft, non-tender; no palpable masses, no distension Ext:    No edema; adequate peripheral perfusion Skin:      Warm and dry; no rash Neuro: alert and oriented x 3 Psych: normal mood and affect  Data Reviewed: Imaging: Chest x-ray 02/28/2016-No active cardiopulmonary disease Chest x-ray 06/13/2018- No active cardiopulmonary disease. I have reviewed the images personally.  PFTs: 01/09/2019 FVC 3.38 (72%), FEV1 2.99 [85%),/F 89, TLC 77%, TLC 103% Minimal restriction  FENO  06/13/2018-228  Labs: CBC 7/90/19-WBC 6.6, eos 12.5%, absolute eosinophil count 825 Blood allergy profile 7/90/90-IgE 108, RAST panel is positive for dog dander, Aspergillus  Assessment:  Asthma, reactive airway disease Set off by occupational exposure to chlorine He is currently stable on Symbicort.  Will continue the same Recheck FENO on return visit consider reduction in dose inhaled corticosteroids and weaning him off inhalers  Obstructive sleep apnea Has not been very compliant of late as his wife is currently hospitalized Encouraged him to resume CPAP when able Recheck download at return visit  Plan/Recommendations: - Continue Symbicort, CPAP  Follow-up in 3 months  Chilton Greathouse MD [facility] 01/09/2019, 12:13 PM  CC: Gordan Payment., MD

## 2021-02-13 ENCOUNTER — Other Ambulatory Visit: Payer: Self-pay

## 2021-02-14 ENCOUNTER — Ambulatory Visit: Payer: No Typology Code available for payment source | Admitting: Cardiology

## 2021-02-14 ENCOUNTER — Encounter: Payer: Self-pay | Admitting: Cardiology

## 2021-02-14 ENCOUNTER — Other Ambulatory Visit: Payer: Self-pay

## 2021-02-14 VITALS — BP 148/84 | HR 66 | Ht 70.0 in | Wt 224.0 lb

## 2021-02-14 DIAGNOSIS — I10 Essential (primary) hypertension: Secondary | ICD-10-CM

## 2021-02-14 DIAGNOSIS — R7303 Prediabetes: Secondary | ICD-10-CM

## 2021-02-14 DIAGNOSIS — R0789 Other chest pain: Secondary | ICD-10-CM

## 2021-02-14 DIAGNOSIS — J984 Other disorders of lung: Secondary | ICD-10-CM

## 2021-02-14 DIAGNOSIS — G4733 Obstructive sleep apnea (adult) (pediatric): Secondary | ICD-10-CM

## 2021-02-14 HISTORY — DX: Other chest pain: R07.89

## 2021-02-14 NOTE — Patient Instructions (Signed)
Medication Instructions:  Your physician recommends that you continue on your current medications as directed. Please refer to the Current Medication list given to you today.   *If you need a refill on your cardiac medications before your next appointment, please call your pharmacy*   Lab Work: None If you have labs (blood work) drawn today and your tests are completely normal, you will receive your results only by: Marland Kitchen MyChart Message (if you have MyChart) OR . A paper copy in the mail If you have any lab test that is abnormal or we need to change your treatment, we will call you to review the results.   Testing/Procedures: Non-Cardiac CT scanning, (CAT scanning), is a noninvasive, special x-ray that produces cross-sectional images of the body using x-rays and a computer. CT scans help physicians diagnose and treat medical conditions. For some CT exams, a contrast material is used to enhance visibility in the area of the body being studied. CT scans provide greater clarity and reveal more details than regular x-ray exams.     Follow-Up: At [facility], you and your health needs are our priority.  As part of our continuing mission to provide you with exceptional heart care, we have created designated Provider Care Teams.  These Care Teams include your primary Cardiologist (physician) and Advanced Practice Providers (APPs -  Physician Assistants and Nurse Practitioners) who all work together to provide you with the care you need, when you need it.  We recommend signing up for the patient portal called "MyChart".  Sign up information is provided on this After Visit Summary.  MyChart is used to connect with patients for Virtual Visits (Telemedicine).  Patients are able to view lab/test results, encounter notes, upcoming appointments, etc.  Non-urgent messages can be sent to your provider as well.   To learn more about what you can do with MyChart, go to [email or web address].    Your next  appointment:   2 month(s)  The format for your next appointment:   In Person  Provider:   Gypsy Balsam, MD   Other Instructions

## 2021-02-14 NOTE — Progress Notes (Signed)
Cardiology Consultation:    Date:  02/14/2021   ID:  Melvin Crawford, DOB 05-25-1961, MRN [id number]  PCP:  Gordan Payment., MD  Cardiologist:  Gypsy Balsam, MD   Referring MD: Gordan Payment., MD   Chief Complaint  Patient presents with  . Establish Care    History of Present Illness:    Melvin Crawford is a 60 y.o. male who is being seen today for the evaluation of I have atypical chest pain at the request of Gordan Payment., MD.  With past medical history significant for essential hypertension, obstructive sleep apnea on CPAP mask, prediabetes, dyslipidemia.  He would like to be establish as a patient of our practice.  Recently his uncle died suddenly because of massive heart attack and he is very concerned about his situation.  He described to have some atypical chest pain that episode of chest pain happen very rarely last for few minutes and typically not related to exercise.  There is no shortness of breath no sweating associated with this sensation.  His work requires a lot of walking have no difficulty doing it.  Does not get any chest pain while exercising.  Does not exercise on the regular basis however.  Does not smoke does not drink.  Does have family history of coronary artery disease some premature. He is telling me that he does have dyslipidemia but does not take any medication for it because he has some difficulty tolerating the medications.  Past Medical History:  Diagnosis Date  . Allergic rhinitis 09/19/2018  . Bronchitis 01/18/2016   Last Assessment & Plan:  Relevant Hx: Course: Daily Update: Today's Plan:will give oral abx and pred taper as well as steroid injection in the office today. He udnerstands how to take these meds and se's of each.   Electronically signed by: Jenelle Mages, FNP 02/17/16 [phone]  . Chronic fatigue 01/17/2016   Last Assessment & Plan:  Relevant Hx: Course: Daily Update: Today's Plan:some of this is related to his work and lack of true  exercise which he has to make change with  Electronically signed by: Krystal Clark, NP 01/20/16 [phone]  . Essential hypertension 01/17/2016   Last Assessment & Plan:  Relevant Hx: Course: Daily Update: Today's Plan:this appears to be stable  Electronically signed by: Jenelle Mages, FNP 02/17/16 [phone]  . Exertional dyspnea 03/27/2016  . GERD (gastroesophageal reflux disease)   . High risk medication use 01/17/2016  . Hyperlipidemia 01/17/2016   Last Assessment & Plan:  Relevant Hx: Course: Daily Update: Today's Plan:reviewed his lipids and he has low HDL and discussed FH and genetics and lack of exercise,he does not want meds  Electronically signed by: Krystal Clark, NP 01/20/16 0932  . Hypertension   . Male erectile disorder 01/17/2016   Last Assessment & Plan:  Relevant Hx: Course: Daily Update: Today's Plan:currently stable for him at this time no meds used  Electronically signed by: Krystal Clark, NP 01/20/16 0701  . Obesity 03/27/2016  . OSA (obstructive sleep apnea) 03/27/2016    Sleep study 11/11/3   -  Mild OSA, AHI 12    . Prediabetes 02/17/2016   Last Assessment & Plan:  Relevant Hx: Course: Daily Update: Today's Plan:last a1c was in the 6.1 range and he is aware that the steroids can increase this for him  Electronically signed by: Jenelle Mages, FNP 02/17/16 [phone]  . Psoriasis 01/17/2016   Last Assessment & Plan:  Relevant Hx: Course:  Daily Update: Today's Plan:to his lower legs there are some areas and he has cream at home that he is using for this  Electronically signed by: Krystal Clark, NP 01/20/16 0703  . Restrictive lung disease 01/17/2016   Last Assessment & Plan:  Relevant Hx: Course: Daily Update: Today's Plan:reviewed pulmonary notes with him today and he has been stable and not following with them unless something changes  Electronically signed by: Krystal Clark, NP 01/20/16 [phone]  . Sleep apnea 01/17/2016     06/13/18 -FeNO-228  PFTs 11/23/14 FVC 3.19 (69%) FEV1 2.76 [74%] F/F 81 TLC 78 DLCO 84% Mild restrictive lung disease. No obstruction or DLCO impairment. No bronchodilator response.  06/13/2018-CBC with differential-eosinophils relative 12.5, eosinophils absolute 0.8 06/13/2018- respiratory allergy panel-multiple allergens, IgE 108  09/19/2018-pulmonary function test- FVC 3.43 (73% predicted), ratio  . Vitamin D deficiency 01/17/2016   Last Assessment & Plan:  Relevant Hx: Course: Daily Update: Today's Plan:his level on the labs was lower and he had been off of his med and will resume it  Electronically signed by: Krystal Clark, NP 01/20/16 0700  . Wellness examination 01/18/2016   Last Assessment & Plan:  Relevant Hx: Course: Daily Update: Today's Plan:we discussed preventive aspects of care today in terms of diet and exercise, he is to return in about for FU, reviewed his labs he had drawn at the city which showed he hadcreatinine of 1.3 and both years he has had the same, he is advised to ensure that he is drinking plenty of fluids with his meds which can cause thi    Past Surgical History:  Procedure Laterality Date  . COLONOSCOPY  2012  . INCISION AND DRAINAGE Right 12/03/2013   Procedure: INCISION AND DRAINAGE RIGHT LONG FINGER ;  Surgeon: Tami Ribas, MD;  Location: [facility];  Service: Orthopedics;  Laterality: Right;    Current Medications: Current Meds  Medication Sig  . aspirin 81 MG tablet Take 162 mg by mouth daily.   . budesonide-formoterol (SYMBICORT) 160-4.5 MCG/ACT inhaler Inhale 2 puffs into the lungs 2 (two) times daily.  Marland Kitchen losartan (COZAAR) 100 MG tablet Take 100 mg by mouth daily.  . nabumetone (RELAFEN) 750 MG tablet Take 750 mg by mouth 2 (two) times daily as needed for moderate pain.  Marland Kitchen omeprazole (PRILOSEC) 40 MG capsule Take 40 mg by mouth daily.     Allergies:   Atorvastatin and Codeine   Social History   Socioeconomic History  .  Marital status: Married    Spouse name: Not on file  . Number of children: Not on file  . Years of education: Not on file  . Highest education level: Not on file  Occupational History  . Not on file  Tobacco Use  . Smoking status: Never Smoker  . Smokeless tobacco: Never Used  . Tobacco comment: social smoker 40 years ago  Substance and Sexual Activity  . Alcohol use: No  . Drug use: No  . Sexual activity: Not on file  Other Topics Concern  . Not on file  Social History Narrative  . Not on file   Social Determinants of Health   Financial Resource Strain: Not on file  Food Insecurity: Not on file  Transportation Needs: Not on file  Physical Activity: Not on file  Stress: Not on file  Social Connections: Not on file     Family History: The patient's family history includes Heart disease in his father and mother.  ROS:   Please see the history of present illness.    All 14 point review of systems negative except as described per history of present illness.  EKGs/Labs/Other Studies Reviewed:    The following studies were reviewed today:   EKG:  EKG ishowed normal sinus rhythm, left axis deviation, normal QS complex duration morphology no ST segment changes ordered today.    Recent Labs: No results found for requested labs within last 8760 hours.  Recent Lipid Panel No results found for: CHOL, TRIG, HDL, CHOLHDL, VLDL, LDLCALC, LDLDIRECT  Physical Exam:    VS:  BP (!) 148/84 (BP Location: Right Arm, Patient Position: Sitting)   Pulse 66   Ht 5\' 10"  (1.778 m)   Wt 224 lb (101.6 kg)   SpO2 96%   BMI 32.14 kg/m     Wt Readings from Last 3 Encounters:  02/14/21 224 lb (101.6 kg)  01/09/19 219 lb (99.3 kg)  09/19/18 220 lb (99.8 kg)     GEN:  Well nourished, well developed in no acute distress HEENT: Normal NECK: No JVD; No carotid bruits LYMPHATICS: No lymphadenopathy CARDIAC: RRR, no murmurs, no rubs, no gallops RESPIRATORY:  Clear to auscultation without  rales, wheezing or rhonchi  ABDOMEN: Soft, non-tender, non-distended MUSCULOSKELETAL:  No edema; No deformity  SKIN: Warm and dry NEUROLOGIC:  Alert and oriented x 3 PSYCHIATRIC:  Normal affect   ASSESSMENT:    1. Obstructive sleep apnea syndrome   2. Hypertension, unspecified type   3. Atypical chest pain   4. Essential hypertension   5. Restrictive lung disease   6. Prediabetes    PLAN:    In order of problems listed above:  1. Atypical chest pain.  We had a long discussion about what to do with the situation we discussed possibility of doing coronary CT angio, however he said he cannot afford this test.  His wife who is my patient as well spent many days in the hospital still paying bill for that that is why he prefer to avoid this test.  I think the best approach for his situation would be to perform coronary CT calcium score.  He agreed for that.  He is taking already baby aspirin which I will continue.  We did talk about healthy lifestyle need to exercise on the regular basis and the avoidance of smoking. 2. Essential hypertension blood pressure seems to well controlled continue present management. 3. Dyslipidemia: He tells me that he did try some medication for cholesterol but gave him some side effects that is why he stopped it.  He said his cholesterol is always high.  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 4.  Prediabetes.  We did talk about need to exercise and regular basis to lose weight that should help with his diabetes. 5.  Restrictive lung disease: That being followed by pulmonologist.   Medication Adjustments/Labs and Tests Ordered: Current medicines are reviewed at length with the patient today.  Concerns regarding medicines are outlined above.  Orders Placed This Encounter  Procedures  . CT CARDIAC SCORING (SELF PAY ONLY)  .  EKG 12-Lead   No orders of the defined types were placed in this encounter.   Signed, 09/21/18, MD, [facility]. 02/14/2021 12:31 PM    [facility]

## 2021-03-16 ENCOUNTER — Other Ambulatory Visit: Payer: Self-pay

## 2021-03-16 ENCOUNTER — Ambulatory Visit (INDEPENDENT_AMBULATORY_CARE_PROVIDER_SITE_OTHER)
Admission: RE | Admit: 2021-03-16 | Discharge: 2021-03-16 | Disposition: A | Discharge status: Home / Self Care | Payer: Self-pay | Source: Ambulatory Visit | Attending: Cardiology | Admitting: Cardiology

## 2021-03-16 DIAGNOSIS — R0789 Other chest pain: Secondary | ICD-10-CM

## 2021-03-17 ENCOUNTER — Telehealth: Payer: Self-pay

## 2021-03-17 NOTE — Telephone Encounter (Signed)
Patient notified of test results and is schedule in May

## 2021-03-17 NOTE — Telephone Encounter (Signed)
-----   Message from Georgeanna Lea, MD sent at 03/17/2021 10:49 AM EDT ----- Coronary calcium score only 54.  We will discuss details and management during next visit

## 2021-04-17 ENCOUNTER — Other Ambulatory Visit: Payer: Self-pay

## 2021-04-17 DIAGNOSIS — R21 Rash and other nonspecific skin eruption: Secondary | ICD-10-CM

## 2021-04-17 HISTORY — DX: Rash and other nonspecific skin eruption: R21

## 2021-04-18 ENCOUNTER — Encounter: Payer: Self-pay | Admitting: Cardiology

## 2021-04-18 ENCOUNTER — Other Ambulatory Visit: Payer: Self-pay

## 2021-04-18 ENCOUNTER — Ambulatory Visit: Payer: No Typology Code available for payment source | Admitting: Cardiology

## 2021-04-18 VITALS — BP 162/90 | HR 72 | Ht 69.0 in | Wt 227.0 lb

## 2021-04-18 DIAGNOSIS — G4733 Obstructive sleep apnea (adult) (pediatric): Secondary | ICD-10-CM

## 2021-04-18 DIAGNOSIS — Z8249 Family history of ischemic heart disease and other diseases of the circulatory system: Secondary | ICD-10-CM

## 2021-04-18 DIAGNOSIS — I10 Essential (primary) hypertension: Secondary | ICD-10-CM | POA: Diagnosis not present

## 2021-04-18 DIAGNOSIS — R931 Abnormal findings on diagnostic imaging of heart and coronary circulation: Secondary | ICD-10-CM | POA: Insufficient documentation

## 2021-04-18 DIAGNOSIS — R0789 Other chest pain: Secondary | ICD-10-CM

## 2021-04-18 MED ORDER — EZETIMIBE 10 MG PO TABS: 10.0000 mg | ORAL_TABLET | Freq: Every day | ORAL | 1 refills | Status: DC

## 2021-04-18 MED ORDER — PRAVASTATIN SODIUM 20 MG PO TABS: 20.0000 mg | ORAL_TABLET | Freq: Every evening | ORAL | 1 refills | Status: DC

## 2021-04-18 NOTE — Addendum Note (Signed)
Addended by: Hazle Quant on: 04/18/2021 09:25 AM   Modules accepted: Orders

## 2021-04-18 NOTE — Addendum Note (Signed)
Addended by: Hazle Quant on: 04/18/2021 09:19 AM   Modules accepted: Orders

## 2021-04-18 NOTE — Patient Instructions (Addendum)
Medication Instructions:  Your physician has recommended you make the following change in your medication:   START: zetia 10 mg daily   *If you need a refill on your cardiac medications before your next appointment, please call your pharmacy*   Lab Work: Your physician recommends that you return for lab work 6 week fu: lipid, lft   If you have labs (blood work) drawn today and your tests are completely normal, you will receive your results only by: Marland Kitchen MyChart Message (if you have MyChart) OR . A paper copy in the mail If you have any lab test that is abnormal or we need to change your treatment, we will call you to review the results.   Testing/Procedures: None   Follow-Up: At [facility], you and your health needs are our priority.  As part of our continuing mission to provide you with exceptional heart care, we have created designated Provider Care Teams.  These Care Teams include your primary Cardiologist (physician) and Advanced Practice Providers (APPs -  Physician Assistants and Nurse Practitioners) who all work together to provide you with the care you need, when you need it.  We recommend signing up for the patient portal called "MyChart".  Sign up information is provided on this After Visit Summary.  MyChart is used to connect with patients for Virtual Visits (Telemedicine).  Patients are able to view lab/test results, encounter notes, upcoming appointments, etc.  Non-urgent messages can be sent to your provider as well.   To learn more about what you can do with MyChart, go to [email or web address].    Your next appointment:   3 month(s)  The format for your next appointment:   In Person  Provider:   Gypsy Balsam, MD   Other Instructions

## 2021-04-18 NOTE — Progress Notes (Signed)
Cardiology Office Note:    Date:  04/18/2021   ID:  Melvin Crawford, DOB 16-Aug-1961, MRN [id number]  PCP:  Gordan Payment., MD  Cardiologist:  Gypsy Balsam, MD    Referring MD: Gordan Payment., MD   Chief Complaint  Patient presents with  . Results  Something fine  History of Present Illness:    Melvin Crawford is a 60 y.o. male who came to [place] to be establish as a patient on his anesthesia risk for coronary artery disease.  He does have hypertension with symptoms somewhat difficult to control, prediabetes, dyslipidemia with all.  His uncle had massive heart attack recently and that is what prompted his visit in our office.  He denies having chest pain tightness squeezing pressure burning chest.  He does have some exertional fatigue and shortness of breath.  He was offered to have coronary CT angio however he could not afford it therefore we elected to proceed with calcium score which came somewhat elevated at 54.  Past Medical History:  Diagnosis Date  . Allergic rhinitis 09/19/2018  . Atypical chest pain 02/14/2021  . BMI 32.0-32.9,adult 03/27/2016  . Bronchitis 01/18/2016   Last Assessment & Plan:  Relevant Hx: Course: Daily Update: Today's Plan:will give oral abx and pred taper as well as steroid injection in the office today. He udnerstands how to take these meds and se's of each.   Electronically signed by: Jenelle Mages, FNP 02/17/16 [phone]  . Chronic fatigue 01/17/2016   Last Assessment & Plan:  Relevant Hx: Course: Daily Update: Today's Plan:some of this is related to his work and lack of true exercise which he has to make change with  Electronically signed by: Krystal Clark, NP 01/20/16 [phone]  . Essential hypertension 01/17/2016   Last Assessment & Plan:  Relevant Hx: Course: Daily Update: Today's Plan:this appears to be stable  Electronically signed by: Jenelle Mages, FNP 02/17/16 [phone]  . Exertional dyspnea 03/27/2016  . GERD (gastroesophageal reflux  disease)   . High risk medication use 01/17/2016  . Hyperlipidemia 01/17/2016   Last Assessment & Plan:  Relevant Hx: Course: Daily Update: Today's Plan:reviewed his lipids and he has low HDL and discussed FH and genetics and lack of exercise,he does not want meds  Electronically signed by: Krystal Clark, NP 01/20/16 [phone]  . Hypertension   . Male erectile disorder 01/17/2016   Last Assessment & Plan:  Relevant Hx: Course: Daily Update: Today's Plan:currently stable for him at this time no meds used  Electronically signed by: Krystal Clark, NP 01/20/16 0701  . Obesity 03/27/2016  . OSA (obstructive sleep apnea) 03/27/2016    Sleep study 11/11/3   -  Mild OSA, AHI 12    . Prediabetes 02/17/2016   Last Assessment & Plan:  Relevant Hx: Course: Daily Update: Today's Plan:last a1c was in the 6.1 range and he is aware that the steroids can increase this for him  Electronically signed by: Jenelle Mages, FNP 02/17/16 [phone]  . Psoriasis 01/17/2016   Last Assessment & Plan:  Relevant Hx: Course: Daily Update: Today's Plan:to his lower legs there are some areas and he has cream at home that he is using for this  Electronically signed by: Krystal Clark, NP 01/20/16 0703  . Rash 04/17/2021  . Restrictive lung disease 01/17/2016   Last Assessment & Plan:  Relevant Hx: Course: Daily Update: Today's Plan:reviewed pulmonary notes with him today and he has been stable and not following with  them unless something changes  Electronically signed by: Krystal Clark, NP 01/20/16 [phone]  . Sleep apnea 01/17/2016    06/13/18 -FeNO-228  PFTs 11/23/14 FVC 3.19 (69%) FEV1 2.76 [74%] F/F 81 TLC 78 DLCO 84% Mild restrictive lung disease. No obstruction or DLCO impairment. No bronchodilator response.  06/13/2018-CBC with differential-eosinophils relative 12.5, eosinophils absolute 0.8 06/13/2018- respiratory allergy panel-multiple allergens, IgE 108  09/19/2018-pulmonary function test-  FVC 3.43 (73% predicted), ratio  . Vitamin D deficiency 01/17/2016   Last Assessment & Plan:  Relevant Hx: Course: Daily Update: Today's Plan:his level on the labs was lower and he had been off of his med and will resume it  Electronically signed by: Krystal Clark, NP 01/20/16 0700  . Wellness examination 01/18/2016   Last Assessment & Plan:  Relevant Hx: Course: Daily Update: Today's Plan:we discussed preventive aspects of care today in terms of diet and exercise, he is to return in about for FU, reviewed his labs he had [place] at the city which showed he hadcreatinine of 1.3 and both years he has had the same, he is advised to ensure that he is drinking plenty of fluids with his meds which can cause thi    Past Surgical History:  Procedure Laterality Date  . COLONOSCOPY  2012  . INCISION AND DRAINAGE Right 12/03/2013   Procedure: INCISION AND DRAINAGE RIGHT LONG FINGER ;  Surgeon: Tami Ribas, MD;  Location: [facility];  Service: Orthopedics;  Laterality: Right;    Current Medications: Current Meds  Medication Sig  . albuterol (VENTOLIN HFA) 108 (90 Base) MCG/ACT inhaler Inhale 2 puffs into the lungs as needed for wheezing or cough.  Marland Kitchen aspirin 81 MG tablet Take 162 mg by mouth daily.   . budesonide-formoterol (SYMBICORT) 160-4.5 MCG/ACT inhaler Inhale 2 puffs into the lungs 2 (two) times daily.  Marland Kitchen losartan (COZAAR) 100 MG tablet Take 100 mg by mouth daily.  . nabumetone (RELAFEN) 750 MG tablet Take 750 mg by mouth 2 (two) times daily as needed for moderate pain.  Marland Kitchen omeprazole (PRILOSEC) 40 MG capsule Take 40 mg by mouth daily.     Allergies:   Atorvastatin and Codeine   Social History   Socioeconomic History  . Marital status: Married    Spouse name: Not on file  . Number of children: Not on file  . Years of education: Not on file  . Highest education level: Not on file  Occupational History  . Not on file  Tobacco Use  . Smoking status: Never  Smoker  . Smokeless tobacco: Never Used  . Tobacco comment: social smoker 40 years ago  Substance and Sexual Activity  . Alcohol use: No  . Drug use: No  . Sexual activity: Not on file  Other Topics Concern  . Not on file  Social History Narrative  . Not on file   Social Determinants of Health   Financial Resource Strain: Not on file  Food Insecurity: Not on file  Transportation Needs: Not on file  Physical Activity: Not on file  Stress: Not on file  Social Connections: Not on file     Family History: The patient's family history includes Heart disease in his father and mother. ROS:   Please see the history of present illness.    All 14 point review of systems negative except as described per history of present illness  EKGs/Labs/Other Studies Reviewed:      Recent Labs: No results found for requested labs  within last 8760 hours.  Recent Lipid Panel No results found for: CHOL, TRIG, HDL, CHOLHDL, VLDL, LDLCALC, LDLDIRECT  Physical Exam:    VS:  BP (!) 162/90 (BP Location: Left Arm, Patient Position: Sitting)   Pulse 72   Ht 5\' 9"  (1.753 m)   Wt 227 lb (103 kg)   SpO2 98%   BMI 33.52 kg/m     Wt Readings from Last 3 Encounters:  04/18/21 227 lb (103 kg)  02/14/21 224 lb (101.6 kg)  01/09/19 219 lb (99.3 kg)     GEN:  Well nourished, well developed in no acute distress HEENT: Normal NECK: No JVD; No carotid bruits LYMPHATICS: No lymphadenopathy CARDIAC: RRR, no murmurs, no rubs, no gallops RESPIRATORY:  Clear to auscultation without rales, wheezing or rhonchi  ABDOMEN: Soft, non-tender, non-distended MUSCULOSKELETAL:  No edema; No deformity  SKIN: Warm and dry LOWER EXTREMITIES: no swelling NEUROLOGIC:  Alert and oriented x 3 PSYCHIATRIC:  Normal affect   ASSESSMENT:    1. Essential hypertension   2. Atypical chest pain   3. Elevated coronary artery calcium score of 53   4. Obstructive sleep apnea syndrome   5. Family history of premature coronary  artery disease    PLAN:    In order of problems listed above:  1. Coronary disease calcium score 54.  We did discuss risk factors modifications.  He does take aspirin every single day 81 mg.  Asked him to continue.  I did calculated 10 years risk for coronary artery events including his constant score, 10-year risk of 19.4 this is intermediate risk and this is also recommended stopping I asked him to start taking pravastatin 20 mg daily.  He did have intolerance to statin before. 2. Essential hypertension still elevated I asked him to check his blood pressure in the regular basis for next 2 weeks at home and bring results to me. 3. Underlying diabetes.  I advised him to stick with good diet as well as to be active and exercise.  Watch his carbohydrate intake. 4. Sleep apnea that being managed by internal medicine team.   Medication Adjustments/Labs and Tests Ordered: Current medicines are reviewed at length with the patient today.  Concerns regarding medicines are outlined above.  No orders of the defined types were placed in this encounter.  Medication changes: No orders of the defined types were placed in this encounter.   Signed, 01/11/19, MD, [facility] 04/18/2021 9:07 AM    [facility]

## 2021-06-13 ENCOUNTER — Telehealth: Payer: Self-pay | Admitting: Cardiology

## 2021-06-13 NOTE — Telephone Encounter (Signed)
Melvin Crawford from [facility] was calling to request the last office visit note from this patient's visit 04/18/21.  Please fax note  to  [phone]

## 2021-06-13 NOTE — Telephone Encounter (Signed)
Fax sent at this time.  

## 2021-08-02 ENCOUNTER — Other Ambulatory Visit: Payer: Self-pay

## 2021-08-03 ENCOUNTER — Ambulatory Visit: Payer: No Typology Code available for payment source | Admitting: Cardiology

## 2021-08-03 ENCOUNTER — Other Ambulatory Visit: Payer: Self-pay

## 2021-08-03 ENCOUNTER — Encounter: Payer: Self-pay | Admitting: Cardiology

## 2021-08-03 VITALS — BP 130/72 | HR 66 | Ht 68.0 in | Wt 222.8 lb

## 2021-08-03 DIAGNOSIS — R931 Abnormal findings on diagnostic imaging of heart and coronary circulation: Secondary | ICD-10-CM

## 2021-08-03 DIAGNOSIS — E785 Hyperlipidemia, unspecified: Secondary | ICD-10-CM | POA: Diagnosis not present

## 2021-08-03 DIAGNOSIS — R0609 Other forms of dyspnea: Secondary | ICD-10-CM

## 2021-08-03 DIAGNOSIS — R7303 Prediabetes: Secondary | ICD-10-CM

## 2021-08-03 DIAGNOSIS — I10 Essential (primary) hypertension: Secondary | ICD-10-CM | POA: Diagnosis not present

## 2021-08-03 DIAGNOSIS — G4733 Obstructive sleep apnea (adult) (pediatric): Secondary | ICD-10-CM | POA: Diagnosis not present

## 2021-08-03 DIAGNOSIS — R0600 Dyspnea, unspecified: Secondary | ICD-10-CM

## 2021-08-03 NOTE — Patient Instructions (Signed)
Medication Instructions:  Your physician recommends that you continue on your current medications as directed. Please refer to the Current Medication list given to you today.  *If you need a refill on your cardiac medications before your next appointment, please call your pharmacy*   Lab Work: Your physician recommends that you return for lab work in: Direct LDL today. If you have labs (blood work) drawn today and your tests are completely normal, you will receive your results only by: MyChart Message (if you have MyChart) OR A paper copy in the mail If you have any lab test that is abnormal or we need to change your treatment, we will call you to review the results.   Testing/Procedures: None   Follow-Up: At [facility], you and your health needs are our priority.  As part of our continuing mission to provide you with exceptional heart care, we have created designated Provider Care Teams.  These Care Teams include your primary Cardiologist (physician) and Advanced Practice Providers (APPs -  Physician Assistants and Nurse Practitioners) who all work together to provide you with the care you need, when you need it.  We recommend signing up for the patient portal called "MyChart".  Sign up information is provided on this After Visit Summary.  MyChart is used to connect with patients for Virtual Visits (Telemedicine).  Patients are able to view lab/test results, encounter notes, upcoming appointments, etc.  Non-urgent messages can be sent to your provider as well.   To learn more about what you can do with MyChart, go to [email or web address].    Your next appointment:   6 month(s)  The format for your next appointment:   In Person  Provider:   Gypsy Balsam, MD   Other Instructions

## 2021-08-03 NOTE — Progress Notes (Signed)
Cardiology Office Note:    Date:  08/03/2021   ID:  Melvin Crawford, DOB 1960-12-05, MRN [id number]  PCP:  Gordan Payment., MD  Cardiologist:  Gypsy Balsam, MD    Referring MD: Gordan Payment., MD   No chief complaint on file. I am doing well  History of Present Illness:    Melvin Crawford is a 60 y.o. male with past medical history significant for prediabetes, essential hypertension, dyslipidemia.  He was referred to [place] for risk stratification's.  Apparently just before he was referred to [place] his uncle had massive heart attack.  He did have calcium score done which showed elevation of 54.  I wanted him to have coronary CT angio but he could not afford it.  The key of management right now is risk factors modifications.  Overall he is doing well.  He denies have any chest pain tightness squeezing pressure burning chest.  He is trying to be a little bit more active.  He still works a lot.  He does not do any structured exercises.  He is trying to modify his diet after he was diagnosed with diabetes.  He lost about 10 pounds since have seen him last time.  I congratulated him for his.  Past Medical History:  Diagnosis Date   Allergic rhinitis 09/19/2018   Atypical chest pain 02/14/2021   BMI 32.0-32.9,adult 03/27/2016   Bronchitis 01/18/2016   Last Assessment & Plan:  Relevant Hx: Course: Daily Update: Today's Plan:will give oral abx and pred taper as well as steroid injection in the office today. He udnerstands how to take these meds and se's of each.   Electronically signed by: Jenelle Mages, FNP 02/17/16 [phone]   Chronic fatigue 01/17/2016   Last Assessment & Plan:  Relevant Hx: Course: Daily Update: Today's Plan:some of this is related to his work and lack of true exercise which he has to make change with  Electronically signed by: Krystal Clark, NP 01/20/16 [phone]   Essential hypertension 01/17/2016   Last Assessment & Plan:  Relevant Hx: Course: Daily Update: Today's  Plan:this appears to be stable  Electronically signed by: Jenelle Mages, FNP 02/17/16 [phone]   Exertional dyspnea 03/27/2016   GERD (gastroesophageal reflux disease)    High risk medication use 01/17/2016   Hyperlipidemia 01/17/2016   Last Assessment & Plan:  Relevant Hx: Course: Daily Update: Today's Plan:reviewed his lipids and he has low HDL and discussed FH and genetics and lack of exercise,he does not want meds  Electronically signed by: Krystal Clark, NP 01/20/16 [phone]   Hypertension    Male erectile disorder 01/17/2016   Last Assessment & Plan:  Relevant Hx: Course: Daily Update: Today's Plan:currently stable for him at this time no meds used  Electronically signed by: Krystal Clark, NP 01/20/16 0701   Obesity 03/27/2016   OSA (obstructive sleep apnea) 03/27/2016    Sleep study 11/11/3   -  Mild OSA, AHI 12     Prediabetes 02/17/2016   Last Assessment & Plan:  Relevant Hx: Course: Daily Update: Today's Plan:last a1c was in the 6.1 range and he is aware that the steroids can increase this for him  Electronically signed by: Jenelle Mages, FNP 02/17/16 0918   Psoriasis 01/17/2016   Last Assessment & Plan:  Relevant Hx: Course: Daily Update: Today's Plan:to his lower legs there are some areas and he has cream at home that he is using for this  Electronically signed by: Servando Salina  Brown-Patram, NP 01/20/16 0703   Rash 04/17/2021   Restrictive lung disease 01/17/2016   Last Assessment & Plan:  Relevant Hx: Course: Daily Update: Today's Plan:reviewed pulmonary notes with him today and he has been stable and not following with them unless something changes  Electronically signed by: Krystal Clark, NP 01/20/16 0704   Sleep apnea 01/17/2016    06/13/18 -FeNO-228  PFTs 11/23/14 FVC 3.19 (69%) FEV1 2.76 [74%] F/F 81 TLC 78 DLCO 84% Mild restrictive lung disease. No obstruction or DLCO impairment. No bronchodilator response.  06/13/2018-CBC with  differential-eosinophils relative 12.5, eosinophils absolute 0.8 06/13/2018- respiratory allergy panel-multiple allergens, IgE 108  09/19/2018-pulmonary function test- FVC 3.43 (73% predicted), ratio   Vitamin D deficiency 01/17/2016   Last Assessment & Plan:  Relevant Hx: Course: Daily Update: Today's Plan:his level on the labs was lower and he had been off of his med and will resume it  Electronically signed by: Krystal Clark, NP 01/20/16 0700   Wellness examination 01/18/2016   Last Assessment & Plan:  Relevant Hx: Course: Daily Update: Today's Plan:we discussed preventive aspects of care today in terms of diet and exercise, he is to return in about for FU, reviewed his labs he had drawn at the city which showed he hadcreatinine of 1.3 and both years he has had the same, he is advised to ensure that he is drinking plenty of fluids with his meds which can cause thi    Past Surgical History:  Procedure Laterality Date   COLONOSCOPY  2012   INCISION AND DRAINAGE Right 12/03/2013   Procedure: INCISION AND DRAINAGE RIGHT LONG FINGER ;  Surgeon: Tami Ribas, MD;  Location: [facility];  Service: Orthopedics;  Laterality: Right;    Current Medications: Current Meds  Medication Sig   albuterol (VENTOLIN HFA) 108 (90 Base) MCG/ACT inhaler Inhale 2 puffs into the lungs as needed for wheezing or cough.   aspirin 81 MG tablet Take 162 mg by mouth daily.    budesonide-formoterol (SYMBICORT) 160-4.5 MCG/ACT inhaler Inhale 2 puffs into the lungs 2 (two) times daily.   Dapagliflozin Propanediol (FARXIGA PO) Take 1 tablet by mouth daily. Unknown strength   ezetimibe (ZETIA) 10 MG tablet Take 1 tablet (10 mg total) by mouth daily.   FARXIGA 10 MG TABS tablet Take 10 mg by mouth daily.   hydrochlorothiazide (HYDRODIURIL) 25 MG tablet Take 25 mg by mouth every morning.   losartan (COZAAR) 100 MG tablet Take 100 mg by mouth daily.   nabumetone (RELAFEN) 750 MG tablet Take 750 mg  by mouth 2 (two) times daily as needed for moderate pain.   omeprazole (PRILOSEC) 40 MG capsule Take 40 mg by mouth daily.     Allergies:   Atorvastatin and Codeine   Social History   Socioeconomic History   Marital status: Married    Spouse name: Not on file   Number of children: Not on file   Years of education: Not on file   Highest education level: Not on file  Occupational History   Not on file  Tobacco Use   Smoking status: Never   Smokeless tobacco: Never   Tobacco comments:    social smoker 40 years ago  Substance and Sexual Activity   Alcohol use: No   Drug use: No   Sexual activity: Not on file  Other Topics Concern   Not on file  Social History Narrative   Not on file   Social Determinants of Health  Financial Resource Strain: Not on file  Food Insecurity: Not on file  Transportation Needs: Not on file  Physical Activity: Not on file  Stress: Not on file  Social Connections: Not on file     Family History: The patient's family history includes Heart disease in his father and mother. ROS:   Please see the history of present illness.    All 14 point review of systems negative except as described per history of present illness  EKGs/Labs/Other Studies Reviewed:      Recent Labs: No results found for requested labs within last 8760 hours.  Recent Lipid Panel No results found for: CHOL, TRIG, HDL, CHOLHDL, VLDL, LDLCALC, LDLDIRECT  Physical Exam:    VS:  BP 130/72 (BP Location: Left Arm, Patient Position: Sitting)   Pulse 66   Ht 5\' 8"  (1.727 m)   Wt 222 lb 12.8 oz (101.1 kg)   SpO2 96%   BMI 33.88 kg/m     Wt Readings from Last 3 Encounters:  08/03/21 222 lb 12.8 oz (101.1 kg)  04/18/21 227 lb (103 kg)  02/14/21 224 lb (101.6 kg)     GEN:  Well nourished, well developed in no acute distress HEENT: Normal NECK: No JVD; No carotid bruits LYMPHATICS: No lymphadenopathy CARDIAC: RRR, no murmurs, no rubs, no gallops RESPIRATORY:  Clear to  auscultation without rales, wheezing or rhonchi  ABDOMEN: Soft, non-tender, non-distended MUSCULOSKELETAL:  No edema; No deformity  SKIN: Warm and dry LOWER EXTREMITIES: no swelling NEUROLOGIC:  Alert and oriented x 3 PSYCHIATRIC:  Normal affect   ASSESSMENT:    1. Essential hypertension   2. Elevated coronary artery calcium score   3. Hypertension, unspecified type   4. Hyperlipidemia, unspecified hyperlipidemia type   5. Elevated coronary artery calcium score of 53   6. OSA (obstructive sleep apnea)   7. Prediabetes   8. Exertional dyspnea    PLAN:    In order of problems listed above:  Essential hypertension blood pressure well controlled continue present management. Elevated calcium score.  Risk modifications in order.  He is on antiplatelet therapy which I will continue. Dyslipidemia: He had difficulty tolerating Lipitor surprising he developed hives after that medication.  He was put on Zetia we will make arrangements for him to have fasting lipid profile done today or other LDL direct since he ate lunch already. Prediabetes he is on appropriate medications he understand the issue I did explain to him the mechanism of his type 2 diabetes and encouraged him to be more active and lose some weight. Obstructive sleep apnea: That followed by antimedicine team.   Medication Adjustments/Labs and Tests Ordered: Current medicines are reviewed at length with the patient today.  Concerns regarding medicines are outlined above.  Orders Placed This Encounter  Procedures   Direct LDL   Medication changes: No orders of the defined types were placed in this encounter.   Signed, 02/16/21, MD, [facility] 08/03/2021 4:12 PM    [facility]

## 2021-08-09 LAB — LDL CHOLESTEROL, DIRECT: LDL Direct: 74 mg/dL (ref 0–99)

## 2021-08-11 ENCOUNTER — Telehealth: Payer: Self-pay

## 2021-08-11 NOTE — Telephone Encounter (Signed)
Left message on patients voicemail to please return our call.   

## 2021-08-11 NOTE — Telephone Encounter (Signed)
Melvin Crawford is returning Morgan's call made to the patient for his results. She states he is at work and unable to answer the phone. He requested she callback to get the results for him.

## 2021-08-11 NOTE — Telephone Encounter (Signed)
-----   Message from Georgeanna Lea, MD sent at 08/11/2021  9:20 AM EDT ----- Cholesterol good, continue present management

## 2021-08-11 NOTE — Telephone Encounter (Signed)
Spoke with patient regarding results and recommendation.  Patient verbalizes understanding and is agreeable to plan of care. Advised patient to call back with any issues or concerns.  

## 2021-09-30 ENCOUNTER — Other Ambulatory Visit: Payer: Self-pay | Admitting: Cardiology

## 2021-11-30 IMAGING — CT CT CARDIAC CORONARY ARTERY CALCIUM SCORE
3 series · 14 of 20 positions shown, 15 images · non-contrast
Comparison: None.
COMPARISON: None.

Addendum:
EXAM:
OVER-READ INTERPRETATION  CT CHEST

The following report is an over-read performed by radiologist Dr.
Rudi Jumper [REDACTED] on 03/16/2021. This
over-read does not include interpretation of cardiac or coronary
anatomy or pathology. The coronary calcium score interpretation by
the cardiologist is attached.
CLINICAL DATA: Risk stratification
Coronary Calcium Score
TECHNIQUE: The patient was scanned on a Siemens Force scanner. Axial
non-contrast 3 mm slices were carried out through the heart. The
data set was analyzed on a dedicated work station and scored using
the Agatson method.

[Series 2: casc 3.0 bv41 2 bestdiast 75 % · axial · 0.40mm/px · z∈[-329,-260]mm · 4 of 39 slices shown, 5 images]
[im 8/39  vessel]
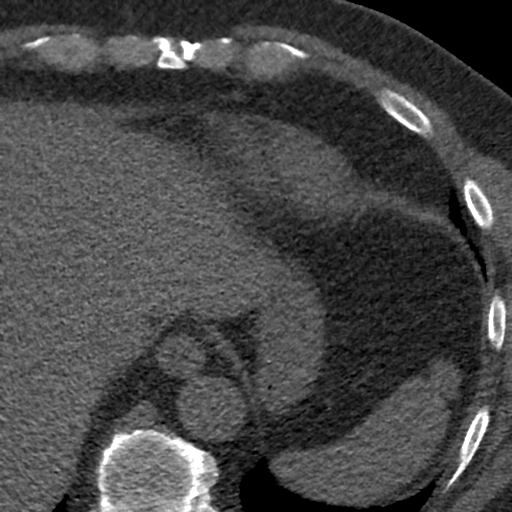
[im 8/39  lung]
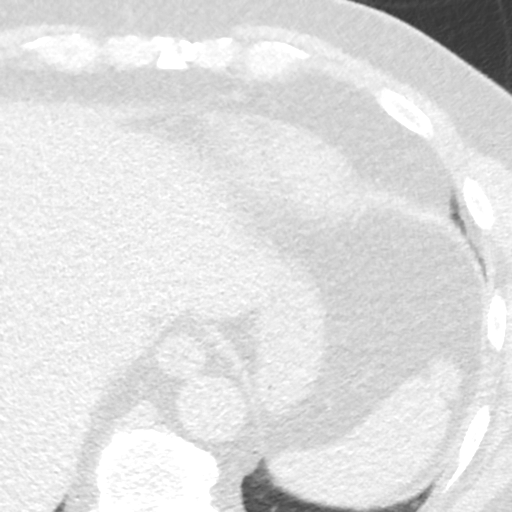
[im 16/39  vessel]
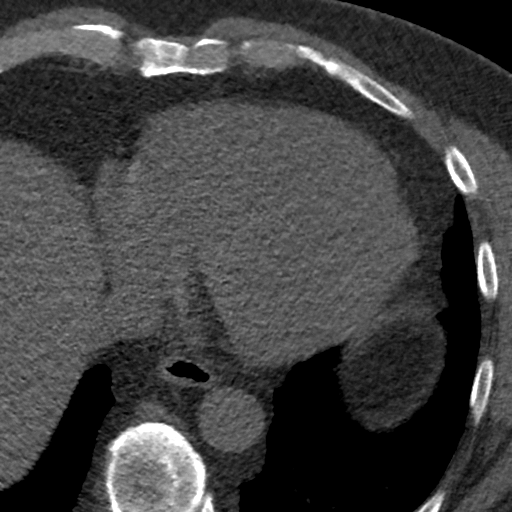
[im 23/39  vessel]
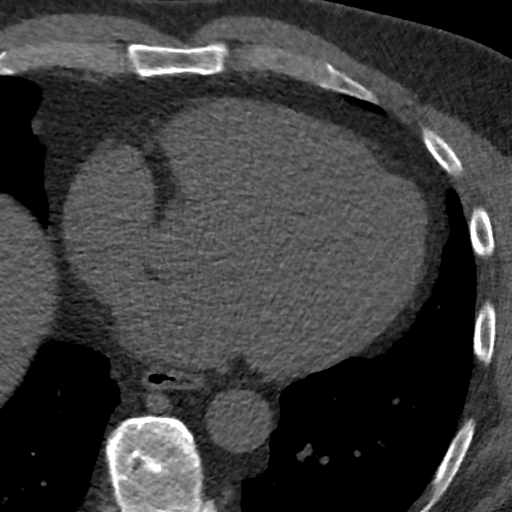
[im 31/39  vessel]
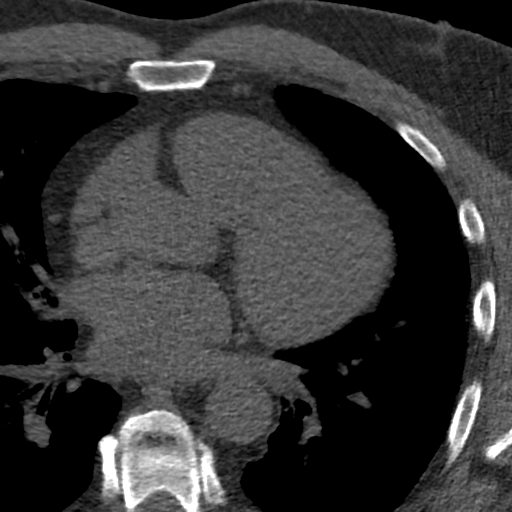

[Series 3: lung 72 % · axial · 0.68mm/px · z∈[-332,-257]mm · 5 of 39 slices shown]
[im 7/39  lung]
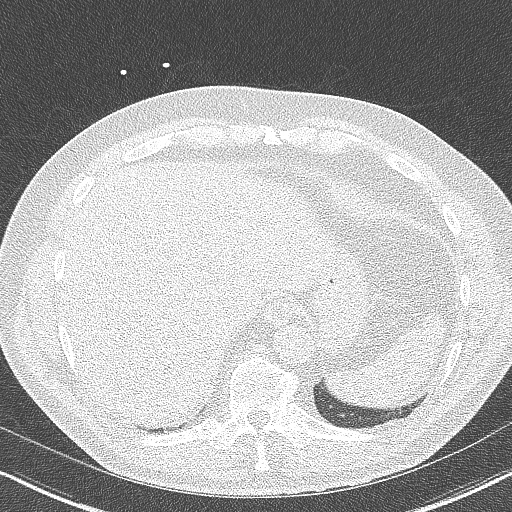
[im 13/39  lung]
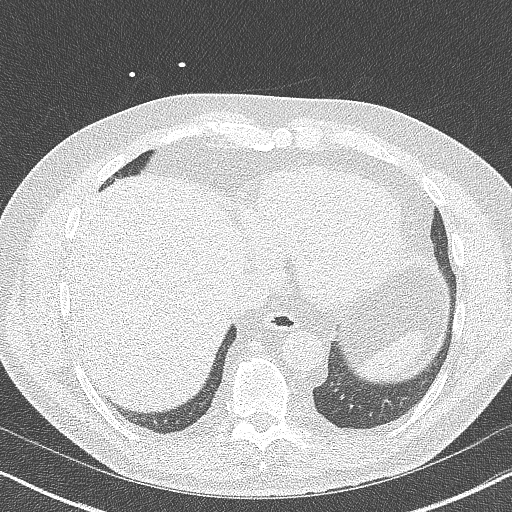
[im 20/39  lung]
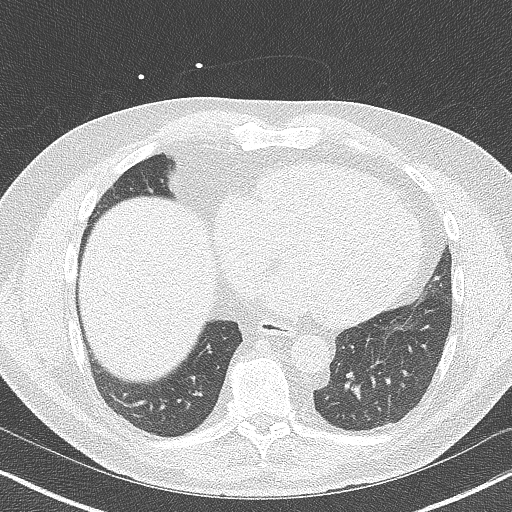
[im 26/39  lung]
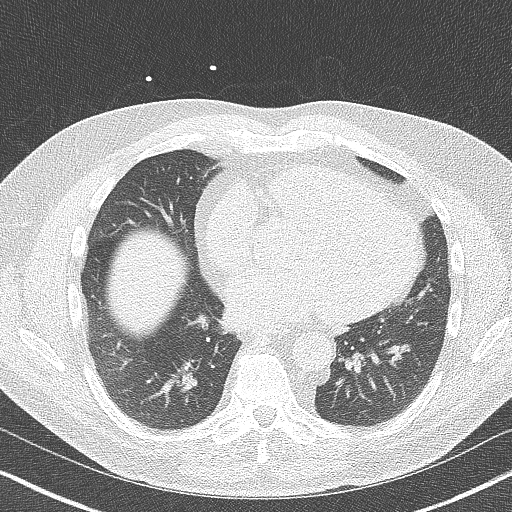
[im 32/39  lung]
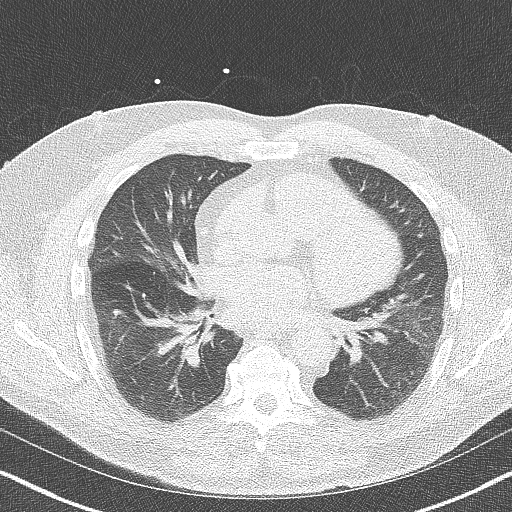

[Series 4: lung st 72 % · axial · 0.68mm/px · z∈[-332,-257]mm · 5 of 39 slices shown]
[im 7/39  lung]
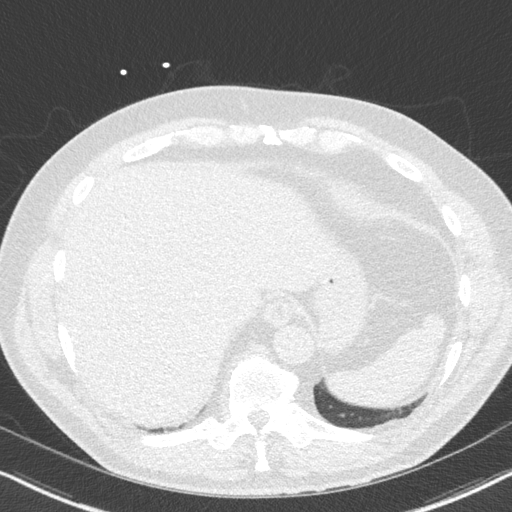
[im 13/39  lung]
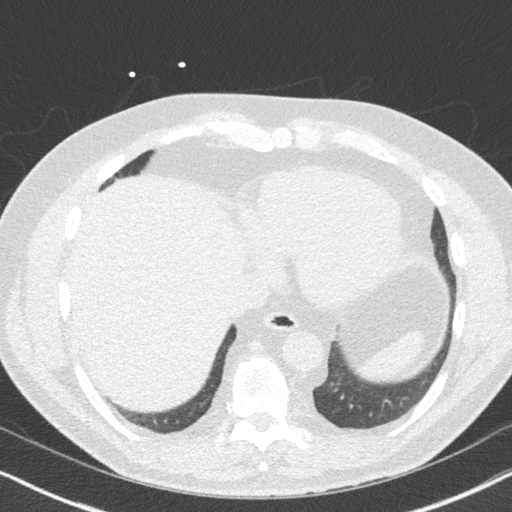
[im 20/39  lung]
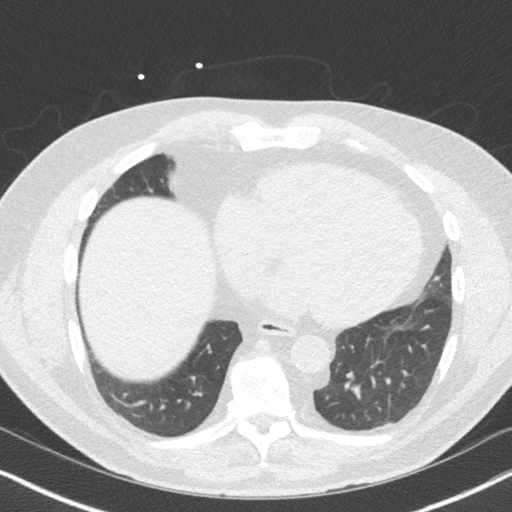
[im 26/39  lung]
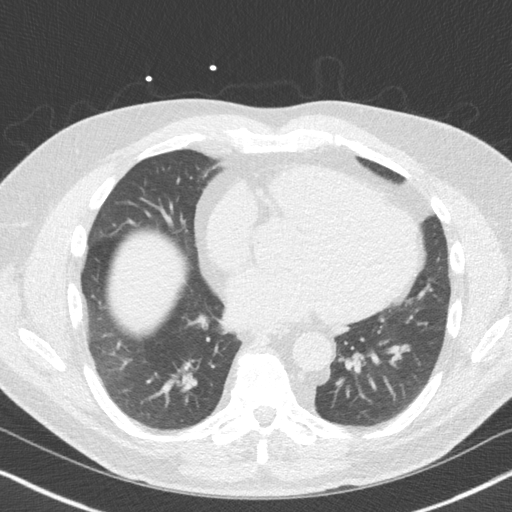
[im 32/39  lung]
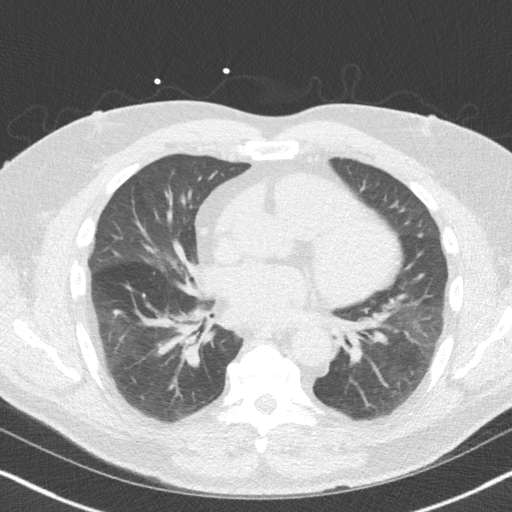

[14 of 20 positions shown; findings below may reference images not displayed]

FINDINGS: Within the visualized portions of the thorax there are no suspicious
appearing pulmonary nodules or masses, there is no acute
consolidative airspace disease, no pleural effusions, no
pneumothorax and no lymphadenopathy. Visualized portions of the
upper abdomen are unremarkable. There are no aggressive appearing
lytic or blastic lesions noted in the visualized portions of the
skeleton.
IMPRESSION: No significant incidental noncardiac findings are noted.
FINDINGS: Non-cardiac: See separate report from [REDACTED].

Ascending Aorta:

Pericardium: Normal

Coronary arteries:
IMPRESSION: Coronary calcium score of 54.3. This was 53 percentile for age and
sex matched control.

*** End of Addendum ***
EXAM:
OVER-READ INTERPRETATION  CT CHEST

The following report is an over-read performed by radiologist Dr.
Rudi Jumper [REDACTED] on 03/16/2021. This
over-read does not include interpretation of cardiac or coronary
anatomy or pathology. The coronary calcium score interpretation by
the cardiologist is attached.
FINDINGS: Within the visualized portions of the thorax there are no suspicious
appearing pulmonary nodules or masses, there is no acute
consolidative airspace disease, no pleural effusions, no
pneumothorax and no lymphadenopathy. Visualized portions of the
upper abdomen are unremarkable. There are no aggressive appearing
lytic or blastic lesions noted in the visualized portions of the
skeleton.
IMPRESSION: No significant incidental noncardiac findings are noted.

## 2022-02-07 ENCOUNTER — Ambulatory Visit: Payer: No Typology Code available for payment source | Admitting: Cardiology

## 2022-04-26 ENCOUNTER — Encounter: Payer: Self-pay | Admitting: Cardiology

## 2022-04-26 ENCOUNTER — Ambulatory Visit: Payer: No Typology Code available for payment source | Admitting: Cardiology

## 2022-04-26 VITALS — BP 130/80 | HR 70 | Ht 68.0 in | Wt 211.0 lb

## 2022-04-26 DIAGNOSIS — K219 Gastro-esophageal reflux disease without esophagitis: Secondary | ICD-10-CM | POA: Diagnosis not present

## 2022-04-26 DIAGNOSIS — G4733 Obstructive sleep apnea (adult) (pediatric): Secondary | ICD-10-CM | POA: Diagnosis not present

## 2022-04-26 DIAGNOSIS — I10 Essential (primary) hypertension: Secondary | ICD-10-CM | POA: Diagnosis not present

## 2022-04-26 DIAGNOSIS — R931 Abnormal findings on diagnostic imaging of heart and coronary circulation: Secondary | ICD-10-CM | POA: Diagnosis not present

## 2022-04-26 DIAGNOSIS — Z6832 Body mass index (BMI) 32.0-32.9, adult: Secondary | ICD-10-CM

## 2022-04-26 NOTE — Progress Notes (Signed)
Cardiology Office Note:    Date:  04/26/2022   ID:  Melvin Crawford, DOB 09-25-1961, MRN [id number]  PCP:  Gordan Payment., MD  Cardiologist:  Gypsy Balsam, MD    Referring MD: Gordan Payment., MD   Chief Complaint  Patient presents with   Follow-up    History of Present Illness:    Melvin Crawford is a 61 y.o. male with past medical history significant for prediabetes, essential hypertension, dyslipidemia.  He was referred to [place] for risk stratification and that happened after his ankle and up having massive heart attack.  We did calcium score which was 54 after that and wanted to do coronary CT angio stress test however he could not afford it and we considered risk factors modifications.  Overall he is doing very well.  He denies have any chest pain tightness squeezing pressure burning chest no palpitations dizziness swelling of lower extremities.  He eliminated completely sweet drinks and he lost many pounds he feels better.  He also have diabetes recently recognized he is doing very good job controlling it he with his hemoglobin A1c of 6.6.  Past Medical History:  Diagnosis Date   Allergic rhinitis 09/19/2018   Atypical chest pain 02/14/2021   BMI 32.0-32.9,adult 03/27/2016   Bronchitis 01/18/2016   Last Assessment & Plan:  Relevant Hx: Course: Daily Update: Today's Plan:will give oral abx and pred taper as well as steroid injection in the office today. He udnerstands how to take these meds and se's of each.   Electronically signed by: Jenelle Mages, FNP 02/17/16 [phone]   Chronic fatigue 01/17/2016   Last Assessment & Plan:  Relevant Hx: Course: Daily Update: Today's Plan:some of this is related to his work and lack of true exercise which he has to make change with  Electronically signed by: Krystal Clark, NP 01/20/16 [phone]   Essential hypertension 01/17/2016   Last Assessment & Plan:  Relevant Hx: Course: Daily Update: Today's Plan:this appears to be stable   Electronically signed by: Jenelle Mages, FNP 02/17/16 [phone]   Exertional dyspnea 03/27/2016   GERD (gastroesophageal reflux disease)    High risk medication use 01/17/2016   Hyperlipidemia 01/17/2016   Last Assessment & Plan:  Relevant Hx: Course: Daily Update: Today's Plan:reviewed his lipids and he has low HDL and discussed FH and genetics and lack of exercise,he does not want meds  Electronically signed by: Krystal Clark, NP 01/20/16 [phone]   Hypertension    Male erectile disorder 01/17/2016   Last Assessment & Plan:  Relevant Hx: Course: Daily Update: Today's Plan:currently stable for him at this time no meds used  Electronically signed by: Krystal Clark, NP 01/20/16 0701   Obesity 03/27/2016   OSA (obstructive sleep apnea) 03/27/2016    Sleep study 11/11/3   -  Mild OSA, AHI 12     Prediabetes 02/17/2016   Last Assessment & Plan:  Relevant Hx: Course: Daily Update: Today's Plan:last a1c was in the 6.1 range and he is aware that the steroids can increase this for him  Electronically signed by: Jenelle Mages, FNP 02/17/16 0918   Psoriasis 01/17/2016   Last Assessment & Plan:  Relevant Hx: Course: Daily Update: Today's Plan:to his lower legs there are some areas and he has cream at home that he is using for this  Electronically signed by: Krystal Clark, NP 01/20/16 0703   Rash 04/17/2021   Restrictive lung disease 01/17/2016   Last Assessment & Plan:  Relevant Hx: Course: Daily Update: Today's Plan:reviewed pulmonary notes with him today and he has been stable and not following with them unless something changes  Electronically signed by: Krystal Clark, NP 01/20/16 0704   Sleep apnea 01/17/2016    06/13/18 -FeNO-228  PFTs 11/23/14 FVC 3.19 (69%) FEV1 2.76 [74%] F/F 81 TLC 78 DLCO 84% Mild restrictive lung disease. No obstruction or DLCO impairment. No bronchodilator response.  06/13/2018-CBC with differential-eosinophils relative 12.5,  eosinophils absolute 0.8 06/13/2018- respiratory allergy panel-multiple allergens, IgE 108  09/19/2018-pulmonary function test- FVC 3.43 (73% predicted), ratio   Vitamin D deficiency 01/17/2016   Last Assessment & Plan:  Relevant Hx: Course: Daily Update: Today's Plan:his level on the labs was lower and he had been off of his med and will resume it  Electronically signed by: Krystal Clark, NP 01/20/16 0700   Wellness examination 01/18/2016   Last Assessment & Plan:  Relevant Hx: Course: Daily Update: Today's Plan:we discussed preventive aspects of care today in terms of diet and exercise, he is to return in about for FU, reviewed his labs he had drawn at the city which showed he hadcreatinine of 1.3 and both years he has had the same, he is advised to ensure that he is drinking plenty of fluids with his meds which can cause thi    Past Surgical History:  Procedure Laterality Date   COLONOSCOPY  2012   INCISION AND DRAINAGE Right 12/03/2013   Procedure: INCISION AND DRAINAGE RIGHT LONG FINGER ;  Surgeon: Tami Ribas, MD;  Location: [facility];  Service: Orthopedics;  Laterality: Right;    Current Medications: Current Meds  Medication Sig   albuterol (VENTOLIN HFA) 108 (90 Base) MCG/ACT inhaler Inhale 2 puffs into the lungs as needed for wheezing or cough.   aspirin 81 MG tablet Take 162 mg by mouth daily.    budesonide-formoterol (SYMBICORT) 160-4.5 MCG/ACT inhaler Inhale 2 puffs into the lungs 2 (two) times daily.   Dapagliflozin Propanediol (FARXIGA PO) Take 1 tablet by mouth daily. Unknown strength   ezetimibe (ZETIA) 10 MG tablet Take 1 tablet (10 mg total) by mouth daily.   FARXIGA 10 MG TABS tablet Take 10 mg by mouth daily.   hydrochlorothiazide (HYDRODIURIL) 25 MG tablet Take 25 mg by mouth every morning.   losartan (COZAAR) 100 MG tablet Take 100 mg by mouth daily.   omeprazole (PRILOSEC) 40 MG capsule Take 40 mg by mouth daily.     Allergies:    Atorvastatin and Codeine   Social History   Socioeconomic History   Marital status: Married    Spouse name: Not on file   Number of children: Not on file   Years of education: Not on file   Highest education level: Not on file  Occupational History   Not on file  Tobacco Use   Smoking status: Never   Smokeless tobacco: Never   Tobacco comments:    social smoker 40 years ago  Substance and Sexual Activity   Alcohol use: No   Drug use: No   Sexual activity: Not on file  Other Topics Concern   Not on file  Social History Narrative   Not on file   Social Determinants of Health   Financial Resource Strain: Not on file  Food Insecurity: Not on file  Transportation Needs: Not on file  Physical Activity: Not on file  Stress: Not on file  Social Connections: Not on file     Family  History: The patient's family history includes Heart disease in his father and mother. ROS:   Please see the history of present illness.    All 14 point review of systems negative except as described per history of present illness  EKGs/Labs/Other Studies Reviewed:      Recent Labs: No results found for requested labs within last 8760 hours.  Recent Lipid Panel    Component Value Date/Time   LDLDIRECT 74 08/03/2021 1610    Physical Exam:    VS:  BP 130/80 (BP Location: Left Arm, Patient Position: Sitting, Cuff Size: Normal)   Pulse 70   Ht 5\' 8"  (1.727 m)   Wt 211 lb (95.7 kg)   SpO2 94%   BMI 32.08 kg/m     Wt Readings from Last 3 Encounters:  04/26/22 211 lb (95.7 kg)  08/03/21 222 lb 12.8 oz (101.1 kg)  04/18/21 227 lb (103 kg)     GEN:  Well nourished, well developed in no acute distress HEENT: Normal NECK: No JVD; No carotid bruits LYMPHATICS: No lymphadenopathy CARDIAC: RRR, no murmurs, no rubs, no gallops RESPIRATORY:  Clear to auscultation without rales, wheezing or rhonchi  ABDOMEN: Soft, non-tender, non-distended MUSCULOSKELETAL:  No edema; No deformity  SKIN:  Warm and dry LOWER EXTREMITIES: no swelling NEUROLOGIC:  Alert and oriented x 3 PSYCHIATRIC:  Normal affect   ASSESSMENT:    1. 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 Essential hypertension   3. OSA (obstructive sleep apnea)   4. Gastroesophageal reflux disease without esophagitis   5. BMI 32.0-32.9,adult    PLAN:    In order of problems listed above:  Elevated calcium score asymptomatic risk factors modifications, he is on antiplatelet therapy in form of aspirin 81 daily which I will continue.  He is intolerant to statin however he is taking Zetia in the last direct LDL was acceptable.  I wanted to do another fasting lipid profile today however he said he just had it done couple weeks ago we will wait for results of it. Essential hypertension: Blood pressure well controlled continue present management. Obstructive sleep apnea: Managed by internal medicine team Dyslipidemia only on Zetia will wait for results of his fasting lipid profile from primary care physician   Medication Adjustments/Labs and Tests Ordered: Current medicines are reviewed at length with the patient today.  Concerns regarding medicines are outlined above.  No orders of the defined types were placed in this encounter.  Medication changes: No orders of the defined types were placed in this encounter.   Signed, Georgeanna Leaobert J. Stormy Connon, MD, [facility] 04/26/2022 2:27 PM    [facility]

## 2022-04-26 NOTE — Patient Instructions (Signed)

## 2023-08-21 DIAGNOSIS — E119 Type 2 diabetes mellitus without complications: Secondary | ICD-10-CM | POA: Insufficient documentation

## 2023-09-09 DIAGNOSIS — N1831 Chronic kidney disease, stage 3a: Secondary | ICD-10-CM | POA: Insufficient documentation

## 2023-09-09 DIAGNOSIS — N182 Chronic kidney disease, stage 2 (mild): Secondary | ICD-10-CM | POA: Insufficient documentation

## 2023-10-07 ENCOUNTER — Encounter: Payer: Self-pay | Admitting: *Deleted

## 2023-10-08 ENCOUNTER — Encounter: Payer: Self-pay | Admitting: Cardiology

## 2023-10-08 ENCOUNTER — Ambulatory Visit: Payer: Managed Care, Other (non HMO) | Attending: Cardiology | Admitting: Cardiology

## 2023-10-08 VITALS — BP 140/78 | HR 70 | Ht 68.0 in | Wt 212.0 lb

## 2023-10-08 DIAGNOSIS — I10 Essential (primary) hypertension: Secondary | ICD-10-CM | POA: Diagnosis not present

## 2023-10-08 DIAGNOSIS — E119 Type 2 diabetes mellitus without complications: Secondary | ICD-10-CM | POA: Diagnosis not present

## 2023-10-08 DIAGNOSIS — E782 Mixed hyperlipidemia: Secondary | ICD-10-CM | POA: Diagnosis not present

## 2023-10-08 DIAGNOSIS — R931 Abnormal findings on diagnostic imaging of heart and coronary circulation: Secondary | ICD-10-CM | POA: Diagnosis not present

## 2023-10-08 NOTE — Progress Notes (Signed)
Cardiology Office Note:  .   Date:  10/08/2023  ID:  Melvin Crawford, DOB March 02, 1961, MRN [id number] PCP: Gordan Payment., MD  [facility] Providers Cardiologist:  Gypsy Balsam, MD    History of Present Illness: .   Melvin Crawford is a 62 y.o. male with a past medical history of hypertension, elevated coronary artery calcium score, OSA, restrictive lung disease, GERD, DM 2, CKD stage III, dyslipidemia.  03/16/2021 calcium score of 54.3, 53rd percentile.  Most recently evaluated by Dr. Bing Matter on 04/26/2022, he was well from a cardiac perspective, was trying to work on getting his diabetes under better control, advised to follow-up in 1 year.  He presents today for follow-up of his elevated calcium score.  Swelling well from a cardiac perspective, offers no formal complaints.  He has plans to retire in the next few weeks and is looking forward to that.  States his cholesterol was recently checked by the nurse practitioner that works for the city, he will request that she send [place] a copy of this.  His blood pressure is slightly elevated in the office today, he states that at home sometimes he gets " good readings and bad readings". He denies chest pain, palpitations, dyspnea, pnd, orthopnea, n, v, dizziness, syncope, edema, weight gain, or early satiety.   ROS: Review of Systems  All other systems reviewed and are negative.    Studies Reviewed: Marland Kitchen   EKG Interpretation Date/Time:  Tuesday October 08 2023 15:49:17 EST Ventricular Rate:  70 PR Interval:  164 QRS Duration:  106 QT Interval:  382 QTC Calculation: 412 R Axis:   56  Text Interpretation: Normal sinus rhythm Normal ECG When compared with ECG of 03-Dec-2013 13:03, No significant change was found Confirmed by Wallis Bamberg [phone]) on 10/08/2023 3:52:54 PM    Cardiac Studies & Procedures          CT SCANS  CT CARDIAC SCORING (SELF PAY ONLY) 03/16/2021  Addendum 03/16/2021  5:42 PM ADDENDUM REPORT: 03/16/2021  17:40  CLINICAL DATA:  Risk stratification  EXAM: Coronary Calcium Score  TECHNIQUE: The patient was scanned on a [employer] scanner. Axial non-contrast 3 mm slices were carried out through the heart. The data set was analyzed on a dedicated work station and scored using the Agatson method.  FINDINGS: Non-cardiac: See separate report from [facility].  Ascending Aorta:  Pericardium: Normal  Coronary arteries:  IMPRESSION: Coronary calcium score of 54.3. This was 43 percentile for age and sex matched control.  Georgeanna Lea, MD   Electronically Signed By: Gypsy Balsam On: 03/16/2021 17:40  Narrative EXAM: OVER-READ INTERPRETATION  CT CHEST  The following report is an over-read performed by radiologist Dr. Trudie Reed of [facility] on 03/16/2021. This over-read does not include interpretation of cardiac or coronary anatomy or pathology. The coronary calcium score interpretation by the cardiologist is attached.  COMPARISON:  None.  FINDINGS: Within the visualized portions of the thorax there are no suspicious appearing pulmonary nodules or masses, there is no acute consolidative airspace disease, no pleural effusions, no pneumothorax and no lymphadenopathy. Visualized portions of the upper abdomen are unremarkable. There are no aggressive appearing lytic or blastic lesions noted in the visualized portions of the skeleton.  IMPRESSION: No significant incidental noncardiac findings are noted.  Electronically Signed: By: Trudie Reed M.D. On: 03/16/2021 15:48          Risk Assessment/Calculations:     HYPERTENSION CONTROL Vitals:   10/08/23 1536  10/08/23 1625  BP: (!) 140/80 (!) 140/78    The patient's blood pressure is elevated above target today.  In order to address the patient's elevated BP: Blood pressure will be monitored at home to determine if medication changes need to be made.          Physical  Exam:   VS:  BP (!) 140/78   Pulse 70   Ht 5\' 8"  (1.727 m)   Wt 212 lb (96.2 kg)   SpO2 97%   BMI 32.23 kg/m    Wt Readings from Last 3 Encounters:  10/08/23 212 lb (96.2 kg)  04/26/22 211 lb (95.7 kg)  08/03/21 222 lb 12.8 oz (101.1 kg)    GEN: Well nourished, well developed in no acute distress NECK: No JVD; No carotid bruits CARDIAC: RRR, no murmurs, rubs, gallops RESPIRATORY:  Clear to auscultation without rales, wheezing or rhonchi  ABDOMEN: Soft, non-tender, non-distended EXTREMITIES:  No edema; No deformity   ASSESSMENT AND PLAN: .   Elevated coronary artery calcium score - Stable with no anginal symptoms. No indication for ischemic evaluation.  Heart healthy diet and regular cardiovascular exercise encouraged.   HTN -blood pressure is elevated today at 140/80, currently on HCTZ and losartan. Will have him keep a BP log x 2 weeks. If his BP remains elevated, we could transition him to valsartan 160 mg daily.  Dyslipidemia - followed by NP at the city, would prefer his LDL be less than 70 as he has an elevated coronary artery calcium score. He will ask them to forward [place] his most recent cholesterol panel.  DM2 - currently wearing a continuous blood glucose monitor, Heart healthy diet and regular cardiovascular exercise encouraged.          Dispo: Blood pressure log x 2 weeks, follow-up in 1 year.  Signed, Flossie Dibble, NP

## 2023-10-08 NOTE — Patient Instructions (Signed)
Medication Instructions:  Your physician recommends that you continue on your current medications as directed. Please refer to the Current Medication list given to you today.  *If you need a refill on your cardiac medications before your next appointment, please call your pharmacy*   Lab Work: NONE If you have labs (blood work) drawn today and your tests are completely normal, you will receive your results only by: MyChart Message (if you have MyChart) OR A paper copy in the mail If you have any lab test that is abnormal or we need to change your treatment, we will call you to review the results.   Testing/Procedures: NONE   Follow-Up: At [facility], you and your health needs are our priority.  As part of our continuing mission to provide you with exceptional heart care, we have created designated Provider Care Teams.  These Care Teams include your primary Cardiologist (physician) and Advanced Practice Providers (APPs -  Physician Assistants and Nurse Practitioners) who all work together to provide you with the care you need, when you need it.  We recommend signing up for the patient portal called "MyChart".  Sign up information is provided on this After Visit Summary.  MyChart is used to connect with patients for Virtual Visits (Telemedicine).  Patients are able to view lab/test results, encounter notes, upcoming appointments, etc.  Non-urgent messages can be sent to your provider as well.   To learn more about what you can do with MyChart, go to [email or web address].    Your next appointment:   1 year(s)  Provider:   Gypsy Balsam, MD    Other Instructions Have work send copy of Cholesterol Blood Work to our office Check and record BP for 2 weeks and bring by office

## 2024-01-08 DIAGNOSIS — R809 Proteinuria, unspecified: Secondary | ICD-10-CM | POA: Insufficient documentation

## 2024-10-01 ENCOUNTER — Encounter: Payer: Self-pay | Admitting: *Deleted

## 2024-10-02 ENCOUNTER — Ambulatory Visit: Payer: Self-pay | Attending: Cardiology | Admitting: Cardiology

## 2024-10-02 ENCOUNTER — Encounter: Payer: Self-pay | Admitting: Cardiology

## 2024-10-02 VITALS — BP 130/84 | HR 55 | Ht 68.0 in | Wt 200.0 lb

## 2024-10-02 DIAGNOSIS — R931 Abnormal findings on diagnostic imaging of heart and coronary circulation: Secondary | ICD-10-CM

## 2024-10-02 DIAGNOSIS — I10 Essential (primary) hypertension: Secondary | ICD-10-CM

## 2024-10-02 DIAGNOSIS — E782 Mixed hyperlipidemia: Secondary | ICD-10-CM

## 2024-10-02 DIAGNOSIS — G4733 Obstructive sleep apnea (adult) (pediatric): Secondary | ICD-10-CM | POA: Diagnosis not present

## 2024-10-02 DIAGNOSIS — E119 Type 2 diabetes mellitus without complications: Secondary | ICD-10-CM

## 2024-10-02 MED ORDER — NEXLIZET 180-10 MG PO TABS: 1.0000 | ORAL_TABLET | Freq: Every day | ORAL | Status: DC

## 2024-10-02 MED ORDER — NEXLIZET 180-10 MG PO TABS: 1.0000 | ORAL_TABLET | Freq: Every day | ORAL | 6 refills | Status: DC

## 2024-10-02 NOTE — Patient Instructions (Signed)
 Medication Instructions:   STOP: Zetia    START: Nexlizet 1 tablet daily   Lab Work: Your physician recommends that you return for lab work in: 6 weeks You need to have labs done when you are fasting.  You can come Monday through Friday 8:30 am to 12:00 pm and 1:15 to 4:30. You do not need to make an appointment as the order has already been placed.     Testing/Procedures: None Ordered   Follow-Up: At [facility], you and your health needs are our priority.  As part of our continuing mission to provide you with exceptional heart care, we have created designated Provider Care Teams.  These Care Teams include your primary Cardiologist (physician) and Advanced Practice Providers (APPs -  Physician Assistants and Nurse Practitioners) who all work together to provide you with the care you need, when you need it.  We recommend signing up for the patient portal called MyChart.  Sign up information is provided on this After Visit Summary.  MyChart is used to connect with patients for Virtual Visits (Telemedicine).  Patients are able to view lab/test results, encounter notes, upcoming appointments, etc.  Non-urgent messages can be sent to your provider as well.   To learn more about what you can do with MyChart, go to [email or web address].    Your next appointment:   12 month(s)  The format for your next appointment:   In Person  Provider:   Lamar Fitch, MD    Other Instructions NA

## 2024-10-02 NOTE — Addendum Note (Signed)
 Addended by: ARLOA PLANAS D on: 10/02/2024 03:21 PM   Modules accepted: Orders

## 2024-10-02 NOTE — Progress Notes (Signed)
 Cardiology Office Note:    Date:  10/02/2024   ID:  Melvin Crawford, DOB Feb 01, 1961, MRN [id number]  PCP:  Thurmond Cathlyn LABOR., MD  Cardiologist:  Lamar Fitch, MD    Referring MD: Thurmond Cathlyn LABOR., MD   Chief Complaint  Patient presents with   Annual Exam    History of Present Illness:    Melvin Crawford is a 63 y.o. male past medical history significant for diabetes, dyslipidemia, hypertension couple years ago he did have calcium drawn which was 71, after that he wanted to coronary CT angio but he did not want to do it.  Comes today to my office for follow-up overall doing well.  Denies of any chest pain tightness squeezing pressure burning chest no palpitation dizziness swelling of lower extremities overall doing well  Past Medical History:  Diagnosis Date   Allergic rhinitis 09/19/2018   Atypical chest pain 02/14/2021   BMI 32.0-32.9,adult 03/27/2016   Bronchitis 01/18/2016   Last Assessment & Plan:  Relevant Hx: Course: Daily Update: Today's Plan:will give oral abx and pred taper as well as steroid injection in the office today. He udnerstands how to take these meds and se's of each.   Electronically signed by: Glendale Dariel Sayres, FNP 02/17/16 [phone]   Chronic fatigue 01/17/2016   Last Assessment & Plan:  Relevant Hx: Course: Daily Update: Today's Plan:some of this is related to his work and lack of true exercise which he has to make change with  Electronically signed by: Eleanor Merlynn Lady, NP 01/20/16 [phone]   Essential hypertension 01/17/2016   Last Assessment & Plan:  Relevant Hx: Course: Daily Update: Today's Plan:this appears to be stable  Electronically signed by: Glendale Dariel Sayres, FNP 02/17/16 [phone]   Exertional dyspnea 03/27/2016   GERD (gastroesophageal reflux disease)    High risk medication use 01/17/2016   Hyperlipidemia 01/17/2016   Last Assessment & Plan:  Relevant Hx: Course: Daily Update: Today's Plan:reviewed his lipids and he has low HDL and discussed FH  and genetics and lack of exercise,he does not want meds  Electronically signed by: Eleanor Merlynn Lady, NP 01/20/16 [phone]   Hypertension    Male erectile disorder 01/17/2016   Last Assessment & Plan:  Relevant Hx: Course: Daily Update: Today's Plan:currently stable for him at this time no meds used  Electronically signed by: Eleanor Merlynn Lady, NP 01/20/16 0701   Obesity 03/27/2016   OSA (obstructive sleep apnea) 03/27/2016    Sleep study 11/11/3   -  Mild OSA, AHI 12     Prediabetes 02/17/2016   Last Assessment & Plan:  Relevant Hx: Course: Daily Update: Today's Plan:last a1c was in the 6.1 range and he is aware that the steroids can increase this for him  Electronically signed by: Glendale Dariel Sayres, FNP 02/17/16 0918   Psoriasis 01/17/2016   Last Assessment & Plan:  Relevant Hx: Course: Daily Update: Today's Plan:to his lower legs there are some areas and he has cream at home that he is using for this  Electronically signed by: Eleanor Merlynn Lady, NP 01/20/16 0703   Rash 04/17/2021   Restrictive lung disease 01/17/2016   Last Assessment & Plan:  Relevant Hx: Course: Daily Update: Today's Plan:reviewed pulmonary notes with him today and he has been stable and not following with them unless something changes  Electronically signed by: Eleanor Merlynn Lady, NP 01/20/16 0704   Sleep apnea 01/17/2016    06/13/18 -FeNO-228  PFTs 11/23/14 FVC 3.19 (69%) FEV1 2.76 [74%] F/F 81  TLC 78 DLCO 84% Mild restrictive lung disease. No obstruction or DLCO impairment. No bronchodilator response.  06/13/2018-CBC with differential-eosinophils relative 12.5, eosinophils absolute 0.8 06/13/2018- respiratory allergy  panel-multiple allergens, IgE 108  09/19/2018-pulmonary function test- FVC 3.43 (73% predicted), ratio   Vitamin D deficiency 01/17/2016   Last Assessment & Plan:  Relevant Hx: Course: Daily Update: Today's Plan:his level on the labs was lower and he had been off of his med and will resume  it  Electronically signed by: Eleanor Merlynn Lady, NP 01/20/16 0700   Wellness examination 01/18/2016   Last Assessment & Plan:  Relevant Hx: Course: Daily Update: Today's Plan:we discussed preventive aspects of care today in terms of diet and exercise, he is to return in about for FU, reviewed [place] the city which showed he hadcreatinine of 1.3 and both years he has had the same, he is advised to ensure that he is drinking plenty of fluids with his meds which can cause thi    Past Surgical History:  Procedure Laterality Date   COLONOSCOPY  2012   INCISION AND DRAINAGE Right 12/03/2013   Procedure: INCISION AND DRAINAGE RIGHT LONG FINGER ;  Surgeon: Franky JONELLE Curia, MD;  Location: [facility];  Service: Orthopedics;  Laterality: Right;    Current Medications: Current Meds  Medication Sig   albuterol  (ACCUNEB ) 1.25 MG/3ML nebulizer solution Inhale 1.25 mg into the lungs every 6 (six) hours as needed for shortness of breath.   albuterol  (VENTOLIN  HFA) 108 (90 Base) MCG/ACT inhaler Inhale 2 puffs into the lungs as needed for wheezing or cough.   amLODipine (NORVASC) 5 MG tablet Take 5 mg by mouth daily.   aspirin 81 MG tablet Take 162 mg by mouth daily.    budesonide -formoterol  (SYMBICORT ) 160-4.5 MCG/ACT inhaler Inhale 2 puffs into the lungs 2 (two) times daily.   ezetimibe  (ZETIA ) 10 MG tablet Take 1 tablet (10 mg total) by mouth daily.   FARXIGA 10 MG TABS tablet Take 10 mg by mouth daily.   losartan (COZAAR) 100 MG tablet Take 100 mg by mouth daily.   omeprazole (PRILOSEC) 40 MG capsule Take 40 mg by mouth daily.     Allergies:   Atorvastatin and Codeine   Social History   Socioeconomic History   Marital status: Married    Spouse name: Not on file   Number of children: Not on file   Years of education: Not on file   Highest education level: Not on file  Occupational History   Not on file  Tobacco Use   Smoking status: Never    Smokeless tobacco: Never   Tobacco comments:    social smoker 40 years ago  Substance and Sexual Activity   Alcohol use: No   Drug use: No   Sexual activity: Not on file  Other Topics Concern   Not on file  Social History Narrative   Not on file   Social Drivers of Health   Financial Resource Strain: Not on file  Food Insecurity: Low Risk  (02/18/2024)   Received from [facility]   Hunger Vital Sign    Within the past 12 months, you worried that your food would run out before you got money to buy more: Never true    Within the past 12 months, the food you bought just didn't last and you didn't have money to get more. : Never true  Transportation Needs: No Transportation Needs (02/18/2024)   Received from [facility]   Transportation    In the past 12 months, has lack of reliable transportation kept you from medical appointments, meetings, work or from getting things needed for daily living? : No  Physical Activity: Not on file  Stress: Not on file  Social Connections: Not on file     Family History: The patient's family history includes Heart disease in his father and mother. ROS:   Please see the history of present illness.    All 14 point review of systems negative except as described per history of present illness  EKGs/Labs/Other Studies Reviewed:         Recent Labs: No results found for requested labs within last 365 days.  Recent Lipid Panel    Component Value Date/Time   LDLDIRECT 74 08/03/2021 1610    Physical Exam:    VS:  BP 130/84   Pulse (!) 55   Ht 5' 8 (1.727 m)   Wt 200 lb (90.7 kg)   SpO2 97%   BMI 30.41 kg/m     Wt Readings from Last 3 Encounters:  10/02/24 200 lb (90.7 kg)  10/08/23 212 lb (96.2 kg)  04/26/22 211 lb (95.7 kg)     GEN:  Well nourished, well developed in no acute distress HEENT: Normal NECK: No JVD; No carotid bruits LYMPHATICS: No lymphadenopathy CARDIAC: RRR, no murmurs, no rubs, no gallops RESPIRATORY:  Clear  to auscultation without rales, wheezing or rhonchi  ABDOMEN: Soft, non-tender, non-distended MUSCULOSKELETAL:  No edema; No deformity  SKIN: Warm and dry LOWER EXTREMITIES: no swelling NEUROLOGIC:  Alert and oriented x 3 PSYCHIATRIC:  Normal affect   ASSESSMENT:    1. Hypertension, unspecified type   2. 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 Essential hypertension   4. OSA (obstructive sleep apnea)   5. Type 2 diabetes mellitus without complication, without long-term current use of insulin (HCC)   6. Mixed hyperlipidemia    PLAN:    In order of problems listed above:  Elevated calcium score, on antiplatelet therapy which I continue, Dyslipidemia I did review KPN which show me his LDL of 79 and HDL of 34.  He is intolerant to statin, we will switch him from Zetia  to Nexlizet at Essential hypertension blood pressure well-controlled continue present management Diabetes, last hemoglobin A1c 6.3 reasonable control. We did talk about need to exercise on the regular basis   Medication Adjustments/Labs and Tests Ordered: Current medicines are reviewed at length with the patient today.  Concerns regarding medicines are outlined above.  Orders Placed This Encounter  Procedures   EKG 12-Lead   Medication changes: No orders of the defined types were placed in this encounter.   Signed, Lamar DOROTHA Fitch, MD, [facility] 10/02/2024 3:07 PM    [facility]

## 2024-10-06 ENCOUNTER — Ambulatory Visit (INDEPENDENT_AMBULATORY_CARE_PROVIDER_SITE_OTHER): Payer: Self-pay

## 2024-10-06 ENCOUNTER — Ambulatory Visit

## 2024-10-06 VITALS — BP 160/75 | HR 60 | Temp 98.6°F | Ht 69.0 in | Wt 203.4 lb

## 2024-10-06 DIAGNOSIS — J4520 Mild intermittent asthma, uncomplicated: Secondary | ICD-10-CM | POA: Diagnosis not present

## 2024-10-06 MED ORDER — ALBUTEROL SULFATE HFA 108 (90 BASE) MCG/ACT IN AERS: 2.0000 | INHALATION_SPRAY | RESPIRATORY_TRACT | 12 refills | Status: DC | PRN

## 2024-10-06 MED ORDER — BUDESONIDE-FORMOTEROL FUMARATE 160-4.5 MCG/ACT IN AERO: 2.0000 | INHALATION_SPRAY | Freq: Two times a day (BID) | RESPIRATORY_TRACT | 3 refills | Status: DC

## 2024-10-06 NOTE — Progress Notes (Signed)
 Subjective:   PATIENT ID: Melvin Crawford GENDER: male DOB: [birth year], MRN: [id number]   HPI Discussed the use of AI scribe software for clinical note transcription with the patient, who gave verbal consent to proceed.  History of Present Illness [name] is a 63 year old male with asthma and obstructive sleep apnea who presents with increased asthma symptoms. He is accompanied by a family member.  He was diagnosed with reactive airway disease/asthma in 2020 following pulmonary function tests and a FENO test. He has been using an inhaler since then. Over the past month, he has experienced increased wheezing and difficulty catching his breath, particularly during physical activities like raking leaves. He uses a nebulizer more frequently and occasionally takes two puffs of Symbicort  as needed. No recent asthma exacerbations have required prednisone  or other steroids. Symptoms have slightly improved over the past four to five days.  In 2020, he was exposed to chlorine gas but does not recall receiving any steroid treatment at that time.  He has obstructive sleep apnea and uses a CPAP machine, which he maintains himself. His sleep study was conducted in 2003, and he has intentionally lost 30-40 pounds since then. He continues to use the CPAP and reports no issues with the machine's function.  He experiences acid reflux but denies sinus issues, postnasal drip, fever, chills, night sweats, loss of appetite, or unintentional weight loss. He has two dogs at home and denies any recent exposure to dust or other triggers aside from his usual activities.     Past Medical History:  Diagnosis Date   Allergic rhinitis 09/19/2018   Atypical chest pain 02/14/2021   BMI 32.0-32.9,adult 03/27/2016   Bronchitis 01/18/2016   Last Assessment & Plan:  Relevant Hx: Course: Daily Update: Today's Plan:will give oral abx and pred taper as well as steroid injection in the office today. He udnerstands how to  take these meds and se's of each.   Electronically signed by: Glendale Dariel Sayres, FNP 02/17/16 [phone]   Chronic fatigue 01/17/2016   Last Assessment & Plan:  Relevant Hx: Course: Daily Update: Today's Plan:some of this is related to his work and lack of true exercise which he has to make change with  Electronically signed by: Eleanor Merlynn Lady, NP 01/20/16 [phone]   Essential hypertension 01/17/2016   Last Assessment & Plan:  Relevant Hx: Course: Daily Update: Today's Plan:this appears to be stable  Electronically signed by: Glendale Dariel Sayres, FNP 02/17/16 [phone]   Exertional dyspnea 03/27/2016   GERD (gastroesophageal reflux disease)    High risk medication use 01/17/2016   Hyperlipidemia 01/17/2016   Last Assessment & Plan:  Relevant Hx: Course: Daily Update: Today's Plan:reviewed his lipids and he has low HDL and discussed FH and genetics and lack of exercise,he does not want meds  Electronically signed by: Eleanor Merlynn Lady, NP 01/20/16 [phone]   Hypertension    Male erectile disorder 01/17/2016   Last Assessment & Plan:  Relevant Hx: Course: Daily Update: Today's Plan:currently stable for him at this time no meds used  Electronically signed by: Eleanor Merlynn Lady, NP 01/20/16 0701   Obesity 03/27/2016   OSA (obstructive sleep apnea) 03/27/2016    Sleep study 11/11/3   -  Mild OSA, AHI 12     Prediabetes 02/17/2016   Last Assessment & Plan:  Relevant Hx: Course: Daily Update: Today's Plan:last a1c was in the 6.1 range and he is aware that the steroids can increase this for him  Electronically signed  by: Glendale Dariel Sayres, FNP 02/17/16 [phone]   Psoriasis 01/17/2016   Last Assessment & Plan:  Relevant Hx: Course: Daily Update: Today's Plan:to his lower legs there are some areas and he has cream at home that he is using for this  Electronically signed by: Eleanor Merlynn Lady, NP 01/20/16 0703   Rash 04/17/2021   Restrictive lung disease 01/17/2016   Last Assessment &  Plan:  Relevant Hx: Course: Daily Update: Today's Plan:reviewed pulmonary notes with him today and he has been stable and not following with them unless something changes  Electronically signed by: Eleanor Merlynn Lady, NP 01/20/16 0704   Sleep apnea 01/17/2016    06/13/18 -FeNO-228  PFTs 11/23/14 FVC 3.19 (69%) FEV1 2.76 [74%] F/F 81 TLC 78 DLCO 84% Mild restrictive lung disease. No obstruction or DLCO impairment. No bronchodilator response.  06/13/2018-CBC with differential-eosinophils relative 12.5, eosinophils absolute 0.8 06/13/2018- respiratory allergy  panel-multiple allergens, IgE 108  09/19/2018-pulmonary function test- FVC 3.43 (73% predicted), ratio   Vitamin D deficiency 01/17/2016   Last Assessment & Plan:  Relevant Hx: Course: Daily Update: Today's Plan:his level on the labs was lower and he had been off of his med and will resume it  Electronically signed by: Eleanor Merlynn Lady, NP 01/20/16 0700   Wellness examination 01/18/2016   Last Assessment & Plan:  Relevant Hx: Course: Daily Update: Today's Plan:we discussed preventive aspects of care today in terms of diet and exercise, he is to return in about for FU, reviewed his labs he had drawn at the city which showed he hadcreatinine of 1.3 and both years he has had the same, he is advised to ensure that he is drinking plenty of fluids with his meds which can cause thi     Family History  Problem Relation Age of Onset   Heart disease Mother    Heart disease Father      Social History   Socioeconomic History   Marital status: Married    Spouse name: Not on file   Number of children: Not on file   Years of education: Not on file   Highest education level: Not on file  Occupational History   Not on file  Tobacco Use   Smoking status: Never    Passive exposure: Past   Smokeless tobacco: Never  Substance and Sexual Activity   Alcohol use: No   Drug use: No   Sexual activity: Not on file  Other Topics Concern    Not on file  Social History Narrative   Not on file   Social Drivers of Health   Financial Resource Strain: Not on file  Food Insecurity: Low Risk  (02/18/2024)   Received from [facility]   Hunger Vital Sign    Within the past 12 months, you worried that your food would run out before you got money to buy more: Never true    Within the past 12 months, the food you bought just didn't last and you didn't have money to get more. : Never true  Transportation Needs: No Transportation Needs (02/18/2024)   Received from [facility]    In the past 12 months, has lack of reliable transportation kept you from medical appointments, meetings, work or from getting things needed for daily living? : No  Physical Activity: Not on file  Stress: Not on file  Social Connections: Not on file  Intimate Partner Violence: Not on file     Allergies  Allergen Reactions  Atorvastatin Other (See Comments)    hives   Codeine Nausea And Vomiting     Outpatient Medications Prior to Visit  Medication Sig Dispense Refill   albuterol  (ACCUNEB ) 1.25 MG/3ML nebulizer solution Inhale 1.25 mg into the lungs every 6 (six) hours as needed for shortness of breath.     albuterol  (VENTOLIN  HFA) 108 (90 Base) MCG/ACT inhaler Inhale 2 puffs into the lungs as needed for wheezing or cough.     amLODipine (NORVASC) 5 MG tablet Take 5 mg by mouth daily.     aspirin 81 MG tablet Take 162 mg by mouth daily.      Bempedoic Acid-Ezetimibe  (NEXLIZET) 180-10 MG TABS Take 1 tablet by mouth daily. 30 tablet 6   budesonide -formoterol  (SYMBICORT ) 160-4.5 MCG/ACT inhaler Inhale 2 puffs into the lungs 2 (two) times daily. 1 Inhaler 3   FARXIGA 10 MG TABS tablet Take 10 mg by mouth daily.     losartan (COZAAR) 100 MG tablet Take 100 mg by mouth daily.     omeprazole (PRILOSEC) 40 MG capsule Take 40 mg by mouth daily.     Bempedoic Acid-Ezetimibe  (NEXLIZET) 180-10 MG TABS Take 1 tablet by mouth daily.     No  facility-administered medications prior to visit.    ROS Reviewed all systems and reported negative except as above     Objective:   Vitals:   10/06/24 1508  BP: (!) 160/75  Pulse: 60  Temp: 98.6 F (37 C)  TempSrc: Oral  SpO2: 99%  Weight: 203 lb 6.4 oz (92.3 kg)  Height: 5' 9 (1.753 m)    Physical Exam Physical Exam GENERAL: Appropriate to age, no acute distress. HEAD EYES EARS NOSE THROAT: Moist mucous membranes, atraumatic, normocephalic. CHEST: Clear to auscultation bilaterally, no wheezing, no crackles, no rales. CARDIAC: Regular rate and rhythm, normal S1, normal S2, no murmurs, no rubs, no gallops. ABDOMEN: Soft, nontender. NEUROLOGICAL: Motor and sensation grossly intact, alert and oriented times X 3. EXTREMITIES: Warm, well perfused, no edema.     CBC    Component Value Date/Time   WBC 6.6 06/13/2018 1039   RBC 5.22 06/13/2018 1039   HGB 16.4 06/13/2018 1039   HCT 47.8 06/13/2018 1039   PLT 204.0 06/13/2018 1039   MCV 91.7 06/13/2018 1039   MCH 31.8 11/21/2013 1258   MCHC 34.2 06/13/2018 1039   RDW 14.1 06/13/2018 1039   LYMPHSABS 1.1 06/13/2018 1039   MONOABS 0.7 06/13/2018 1039   EOSABS 0.8 (H) 06/13/2018 1039   BASOSABS 0.1 06/13/2018 1039     Chest imaging:  PFT:    Latest Ref Rng & Units 01/09/2019   10:41 AM 09/19/2018   10:37 AM  PFT Results  FVC-Pre L 3.23  3.43   FVC-Predicted Pre % 69  73   FVC-Post L 3.38  3.41   FVC-Predicted Post % 72  72   Pre FEV1/FVC % % 83  83   Post FEV1/FCV % % 89  89   FEV1-Pre L 2.69  2.85   FEV1-Predicted Pre % 76  80   FEV1-Post L 2.99  3.04   DLCO uncorrected ml/min/mmHg 28.07  25.55   DLCO UNC% % 103  81   DLVA Predicted % 128  108   TLC L 5.33  5.21   TLC % Predicted % 77  76   RV % Predicted % 77  69     Labs:    Echo:       Assessment & Plan:  Assessment and Plan Assessment & Plan Mild intermittent asthma Increased symptoms with wheezing and dyspnea, improved slightly  recently. No exacerbations requiring systemic steroids. Current regimen includes albuterol  and Symbicort  as needed. - Use Symbicort  two puffs twice daily for two weeks. - Use albuterol  nebulizer until symptoms are controlled. - Order chest X-ray to rule out other causes. - Prescribed new Symbicort  and albuterol  inhaler. - Advised to wash mouth after Symbicort  use. - Discussed potential need for oral steroids if no improvement.  Obstructive sleep apnea Managed with CPAP. Last sleep study in 2003 showed mild apnea. Significant weight loss since last evaluation. CPAP use reduced snoring. No professional maintenance of CPAP machine. - Offered to contact DME company for supplies if needed. - Discussed option of purchasing CPAP supplies online. - Offered to order updated sleep study if required.        Zola Herter, MD [facility] Office: [phone]

## 2024-10-06 NOTE — Patient Instructions (Signed)
  VISIT SUMMARY: You visited us  today due to increased asthma symptoms, including wheezing and difficulty breathing, especially during physical activities. We also reviewed your obstructive sleep apnea management.  YOUR PLAN: -MILD INTERMITTENT ASTHMA: Asthma is a condition where your airways narrow and swell, making it difficult to breathe. You will use Symbicort  two puffs twice daily for two weeks and continue using the albuterol  nebulizer until your symptoms are controlled. We have ordered a chest X-ray to rule out other causes and prescribed new Symbicort  and albuterol  inhalers. Please remember to wash your mouth after using Symbicort . If your symptoms do not improve, we may need to consider oral steroids.  -OBSTRUCTIVE SLEEP APNEA: Obstructive sleep apnea is a condition where your breathing stops and starts during sleep due to blocked airways. You are managing this with a CPAP machine. We discussed the option of contacting a DME company for supplies or purchasing them online. If needed, we can also order an updated sleep study.  INSTRUCTIONS: Please follow up with us  if your asthma symptoms do not improve or if you experience any new symptoms. Ensure you are using your CPAP machine regularly and contact us  if you need any supplies or an updated sleep study. We will notify you with the results of your chest X-ray.

## 2024-10-07 ENCOUNTER — Other Ambulatory Visit (HOSPITAL_COMMUNITY): Payer: Self-pay

## 2024-10-07 ENCOUNTER — Telehealth: Payer: Self-pay

## 2024-10-07 ENCOUNTER — Telehealth: Payer: Self-pay | Admitting: *Deleted

## 2024-10-07 MED ORDER — ALBUTEROL SULFATE HFA 108 (90 BASE) MCG/ACT IN AERS: 2.0000 | INHALATION_SPRAY | Freq: Four times a day (QID) | RESPIRATORY_TRACT | 12 refills | Status: AC | PRN

## 2024-10-07 MED ORDER — ALBUTEROL SULFATE 1.25 MG/3ML IN NEBU: 1.2500 mg | INHALATION_SOLUTION | Freq: Four times a day (QID) | RESPIRATORY_TRACT | 4 refills | Status: DC | PRN

## 2024-10-07 MED ORDER — ALBUTEROL SULFATE (2.5 MG/3ML) 0.083% IN NEBU: 2.5000 mg | INHALATION_SOLUTION | Freq: Four times a day (QID) | RESPIRATORY_TRACT | 12 refills | Status: AC | PRN

## 2024-10-07 NOTE — Telephone Encounter (Unsigned)
 Copied from CRM [id number]. Topic: Clinical - Prescription Issue >> Oct 06, 2024  3:52 PM Devaughn RAMAN wrote: Reason for CRM: Amy with [facility] called regarding the directions for the albuterol  inhaler, Amy stated the rx states as needed and not how many times per day. Please f/u regarding this. Pharmacy on file.  [facility] Phone-[phone]

## 2024-10-07 NOTE — Telephone Encounter (Signed)
*  Pulm  Pharmacy Patient Advocate Encounter   Received notification from CoverMyMeds that prior authorization for Symbicort  160-4.5MCG/ACT aerosol  is required/requested.   Insurance verification completed.   The patient is insured through [facility] ADVANTAGE/RX ADVANCE.   Per test claim:  Breyna  10.3gm is preferred by the insurance.  If suggested medication is appropriate, Please send in a new RX and discontinue this one. If not, please advise as to why it's not appropriate so that we may request a Prior Authorization. Please note, some preferred medications may still require a PA.  If the suggested medications have not been trialed and there are no contraindications to their use, the PA will not be submitted, as it will not be approved.  CMM Key: [id number]

## 2024-10-07 NOTE — Telephone Encounter (Signed)
 Received fax from [facility] stating pt needs PA for the Nexlizet 180/10mg  pills sent in per Dr. Bernie. Should call [phone] for PA. Thank you

## 2024-10-07 NOTE — Addendum Note (Signed)
 Addended by: ZAIDA MASON on: 10/07/2024 12:10 PM   Modules accepted: Orders

## 2024-10-08 ENCOUNTER — Other Ambulatory Visit (HOSPITAL_COMMUNITY): Payer: Self-pay

## 2024-10-08 ENCOUNTER — Telehealth: Payer: Self-pay | Admitting: Pharmacy Technician

## 2024-10-08 ENCOUNTER — Telehealth: Payer: Self-pay | Admitting: *Deleted

## 2024-10-08 DIAGNOSIS — E782 Mixed hyperlipidemia: Secondary | ICD-10-CM

## 2024-10-08 DIAGNOSIS — R931 Abnormal findings on diagnostic imaging of heart and coronary circulation: Secondary | ICD-10-CM

## 2024-10-08 NOTE — Telephone Encounter (Signed)
 Pharmacy Patient Advocate Encounter   Received notification from Pt Calls Messages that prior authorization for nexlizet is required/requested.   Insurance verification completed.   The patient is insured through [facility] ADVANTAGE/RX ADVANCE.   Per test claim: PA required; PA started via CoverMyMeds. KEY [id number] . Please see clinical question(s) below that I am not finding the answer to in their chart and advise.     Hi. Insurance is asking for lipid labs from the last 120 days. The labs in kpn are from 2022, the labs in [facility] are from 11/27/2023, the labs in epic/care everywhere are from 02/18/24. Lipid labs were ordered 10/02/24 but he has not done them yet. Those labs would have to be done for us  to complete this prior authorization. Thank you  Sent message in other encounter

## 2024-10-08 NOTE — Telephone Encounter (Signed)
 Copied from CRM [id number]. Topic: Clinical - Prescription Issue >> Oct 07, 2024  1:52 PM Joesph PARAS wrote: Reason for CRM: Pharmacy is calling with inquiry about nebulizer solutions. Pharmacy has proventil  and accuneb  but they need to know which one patient should be one. Please clarify at [phone].  Called Amy back and informed Accuneb  was dx and Albuterol  0.083 sent yesterday. She says Accuneb  was difficult to get. NFN

## 2024-10-09 ENCOUNTER — Other Ambulatory Visit: Payer: Self-pay

## 2024-10-09 DIAGNOSIS — J4520 Mild intermittent asthma, uncomplicated: Secondary | ICD-10-CM

## 2024-10-09 MED ORDER — BUDESONIDE-FORMOTEROL FUMARATE 160-4.5 MCG/ACT IN AERO: 2.0000 | INHALATION_SPRAY | Freq: Two times a day (BID) | RESPIRATORY_TRACT | 12 refills | Status: DC

## 2024-10-09 NOTE — Telephone Encounter (Signed)
 Advised patient that labs are needed for insurance to approve. Pt verbalized understanding and had no additional questions.

## 2024-10-09 NOTE — Telephone Encounter (Signed)
 Hi. Insurance is asking for lipid labs from the last 120 days. The labs in [facility] are from 2022, the labs in [facility] are from 11/27/2023, the labs in [facility] everywhere are from 02/18/24. Lipid labs were ordered 10/02/24 but he has not done them yet. Those labs would have to be done for us  to complete this prior authorization. Thank you

## 2024-10-09 NOTE — Telephone Encounter (Signed)
 Please advise Dr. Zaida, Melvin Crawford  is preferred by insurance rather than Symbicort .

## 2024-10-09 NOTE — Telephone Encounter (Signed)
 Patient informed. NFN

## 2024-10-12 ENCOUNTER — Other Ambulatory Visit: Payer: Self-pay

## 2024-10-12 MED ORDER — FLUTICASONE-SALMETEROL 250-50 MCG/ACT IN AEPB: 1.0000 | INHALATION_SPRAY | Freq: Two times a day (BID) | RESPIRATORY_TRACT | 11 refills | Status: AC

## 2024-10-12 NOTE — Telephone Encounter (Signed)
 Pt.notified

## 2024-10-12 NOTE — Telephone Encounter (Signed)
 Copied from CRM [id number]. Topic: Clinical - Prescription Issue >> Oct 12, 2024  9:32 AM Rilla NOVAK wrote: Reason for CRM: [facility] calling regarding Symbicort . Breyna  is expensive and pharmacy wants to know if the generic Advair could be prescribed.  Please call [phone]  FAX: [phone]   Please advise Dr. Zaida

## 2024-10-13 LAB — LIPID PANEL
Chol/HDL Ratio: 3.9 ratio (ref 0.0–5.0)
Cholesterol, Total: 130 mg/dL (ref 100–199)
HDL: 33 mg/dL — ABNORMAL LOW (ref >39)
LDL Chol Calc (NIH): 75 mg/dL (ref 0–99)
Triglycerides: 120 mg/dL (ref 0–149)
VLDL Cholesterol Cal: 22 mg/dL (ref 5–40)

## 2024-10-13 LAB — ALT: ALT: 22 IU/L (ref 0–44)

## 2024-10-13 LAB — AST: AST: 21 IU/L (ref 0–40)

## 2024-10-15 ENCOUNTER — Ambulatory Visit: Payer: Self-pay | Admitting: Cardiology

## 2024-10-20 NOTE — Telephone Encounter (Signed)
 Labs have been completed

## 2024-10-21 NOTE — Telephone Encounter (Signed)
 Pharmacy Patient Advocate Encounter  Received notification from [employer] that Prior Authorization for nexlizet  has been APPROVED from 10/21/24 to 04/19/25   PA #/Case ID/Reference #: [id number]

## 2024-10-21 NOTE — Telephone Encounter (Signed)
 Pharmacy Patient Advocate Encounter   Received notification from Pt Calls Messages that prior authorization for nexlizet  is required/requested.   Insurance verification completed.   The patient is insured through [facility] [employer].   Per test claim: PA required; PA submitted to above mentioned insurance via Latent Key/confirmation #/EOC [id number] Status is pending

## 2024-10-26 ENCOUNTER — Telehealth: Payer: Self-pay | Admitting: *Deleted

## 2024-10-26 DIAGNOSIS — G5602 Carpal tunnel syndrome, left upper limb: Secondary | ICD-10-CM | POA: Insufficient documentation

## 2024-10-26 MED ORDER — NEXLIZET 180-10 MG PO TABS: 1.0000 | ORAL_TABLET | Freq: Every day | ORAL | 6 refills | Status: AC

## 2024-10-26 NOTE — Telephone Encounter (Signed)
 Hi! We have received notification that Prior Authorization for nexlizet  has been APPROVED from 10/21/24 to 04/19/25. You can now call your pharmacy to fill this medication for you. If you have any other questions, please let me know! Rx refill sent to pharmacy.

## 2024-10-27 ENCOUNTER — Encounter: Payer: Self-pay | Admitting: *Deleted

## 2025-01-01 NOTE — Telephone Encounter (Signed)
 Patient spouse is calling to report that they can not afford the cost of the medication. Requesting financial assistance or medication change.
